# Patient Record
Sex: Male | Born: 1935
Health system: Southern US, Community
[De-identification: ages and names within clinical notes are randomized; demographics above are authoritative.]

## PROBLEM LIST (undated history)

## (undated) DIAGNOSIS — N400 Enlarged prostate without lower urinary tract symptoms: Secondary | ICD-10-CM

## (undated) DIAGNOSIS — R319 Hematuria, unspecified: Secondary | ICD-10-CM

## (undated) DIAGNOSIS — I10 Essential (primary) hypertension: Secondary | ICD-10-CM

## (undated) DIAGNOSIS — K5790 Diverticulosis of intestine, part unspecified, without perforation or abscess without bleeding: Secondary | ICD-10-CM

## (undated) DIAGNOSIS — R7301 Impaired fasting glucose: Secondary | ICD-10-CM

## (undated) DIAGNOSIS — L719 Rosacea, unspecified: Secondary | ICD-10-CM

## (undated) DIAGNOSIS — G44009 Cluster headache syndrome, unspecified, not intractable: Secondary | ICD-10-CM

## (undated) DIAGNOSIS — D369 Benign neoplasm, unspecified site: Secondary | ICD-10-CM

## (undated) DIAGNOSIS — E785 Hyperlipidemia, unspecified: Secondary | ICD-10-CM

## (undated) HISTORY — DX: Hyperlipidemia, unspecified: E78.5

## (undated) HISTORY — DX: Rosacea, unspecified: L71.9

## (undated) HISTORY — DX: Cluster headache syndrome, unspecified, not intractable: G44.009

## (undated) HISTORY — DX: Benign neoplasm, unspecified site: D36.9

## (undated) HISTORY — DX: Diverticulosis of intestine, part unspecified, without perforation or abscess without bleeding: K57.90

## (undated) HISTORY — DX: Impaired fasting glucose: R73.01

## (undated) HISTORY — DX: Benign prostatic hyperplasia without lower urinary tract symptoms: N40.0

## (undated) HISTORY — DX: Hematuria, unspecified: R31.9

## (undated) HISTORY — DX: Essential (primary) hypertension: I10

---

## 1999-11-12 ENCOUNTER — Ambulatory Visit (HOSPITAL_COMMUNITY): Admission: RE | Admit: 1999-11-12 | Discharge: 1999-11-12 | Payer: Self-pay | Admitting: Gastroenterology

## 2005-01-13 ENCOUNTER — Ambulatory Visit (HOSPITAL_COMMUNITY): Admission: RE | Admit: 2005-01-13 | Discharge: 2005-01-13 | Payer: Self-pay | Admitting: Gastroenterology

## 2005-03-31 ENCOUNTER — Ambulatory Visit (HOSPITAL_COMMUNITY): Admission: RE | Admit: 2005-03-31 | Discharge: 2005-03-31 | Payer: Self-pay | Admitting: Urology

## 2011-04-15 ENCOUNTER — Other Ambulatory Visit: Payer: Self-pay | Admitting: Gastroenterology

## 2012-01-26 ENCOUNTER — Other Ambulatory Visit: Payer: Self-pay | Admitting: Dermatology

## 2012-01-26 DIAGNOSIS — C4491 Basal cell carcinoma of skin, unspecified: Secondary | ICD-10-CM

## 2012-01-26 HISTORY — DX: Basal cell carcinoma of skin, unspecified: C44.91

## 2012-05-09 DIAGNOSIS — C4492 Squamous cell carcinoma of skin, unspecified: Secondary | ICD-10-CM

## 2012-05-09 HISTORY — DX: Squamous cell carcinoma of skin, unspecified: C44.92

## 2012-05-24 ENCOUNTER — Other Ambulatory Visit: Payer: Self-pay | Admitting: Dermatology

## 2012-08-03 ENCOUNTER — Other Ambulatory Visit: Payer: Self-pay | Admitting: Dermatology

## 2012-09-14 ENCOUNTER — Other Ambulatory Visit: Payer: Self-pay | Admitting: Dermatology

## 2012-10-19 ENCOUNTER — Other Ambulatory Visit: Payer: Self-pay | Admitting: Dermatology

## 2013-06-06 ENCOUNTER — Other Ambulatory Visit: Payer: Self-pay | Admitting: Dermatology

## 2013-09-05 ENCOUNTER — Other Ambulatory Visit (HOSPITAL_COMMUNITY): Payer: Self-pay | Admitting: Internal Medicine

## 2013-09-05 DIAGNOSIS — M549 Dorsalgia, unspecified: Secondary | ICD-10-CM

## 2013-09-07 ENCOUNTER — Ambulatory Visit (HOSPITAL_COMMUNITY)
Admission: RE | Admit: 2013-09-07 | Discharge: 2013-09-07 | Disposition: A | Discharge status: Home / Self Care | Payer: Medicare Other | Source: Ambulatory Visit | Attending: Internal Medicine | Admitting: Internal Medicine

## 2013-09-07 DIAGNOSIS — Z8051 Family history of malignant neoplasm of kidney: Secondary | ICD-10-CM | POA: Diagnosis not present

## 2013-09-07 DIAGNOSIS — M549 Dorsalgia, unspecified: Secondary | ICD-10-CM | POA: Insufficient documentation

## 2014-11-11 ENCOUNTER — Other Ambulatory Visit: Payer: Self-pay | Admitting: Dermatology

## 2015-01-03 IMAGING — US US RENAL
1 series · 14 of 25 positions shown · non-contrast
Comparison: CT 07/02/2010

CLINICAL DATA: Back pain.  Family history of renal cell carcinoma.

EXAM:
RENAL/URINARY TRACT ULTRASOUND COMPLETE

[Series 1: us renal · 0.21mm/px · 50 acquisitions, 14 frames shown]
[im 1/50]
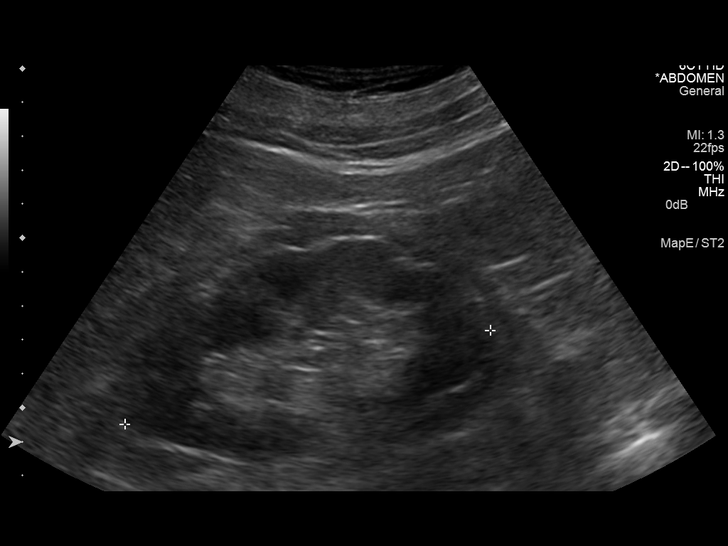
[im 5/50]
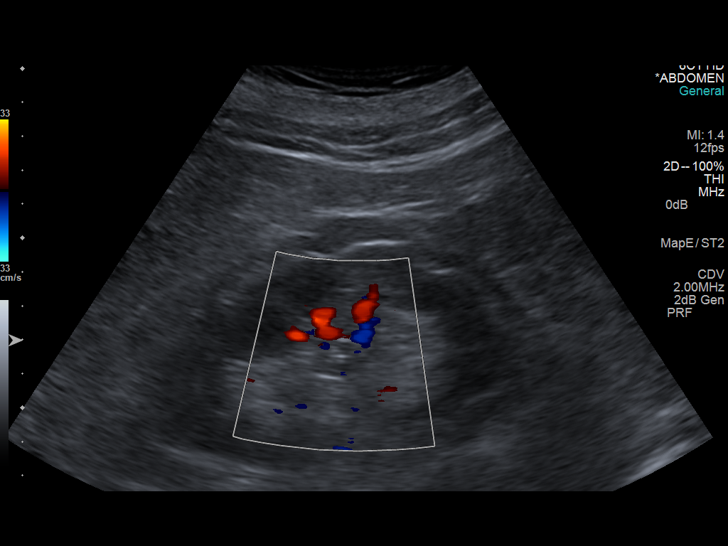
[im 9/50]
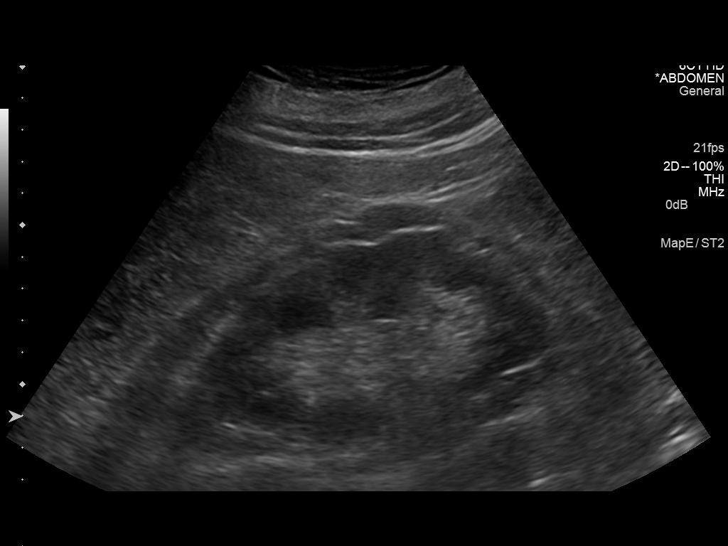
[im 13/50]
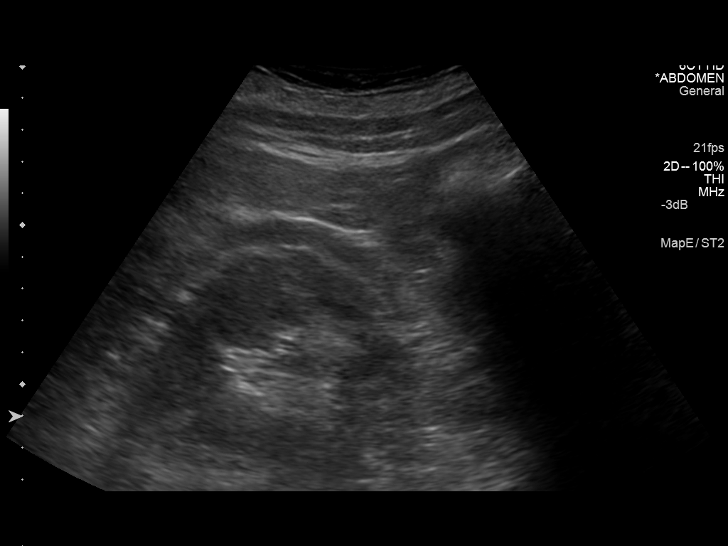
[im 17/50]
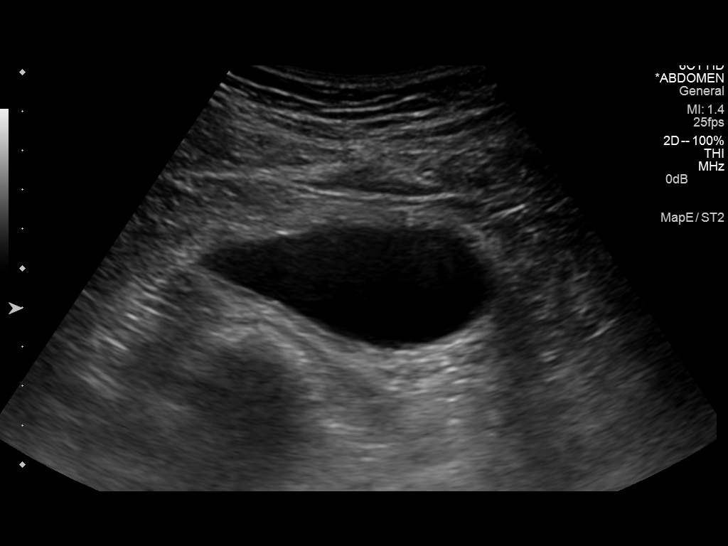
[im 19/50]
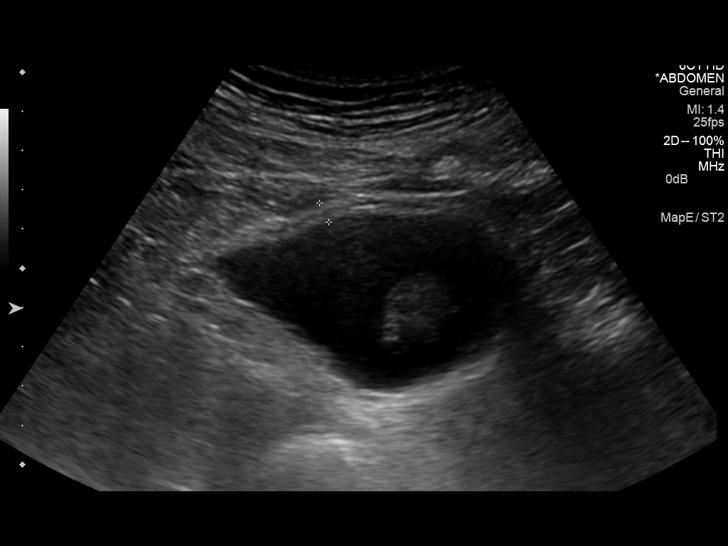
[im 23/50]
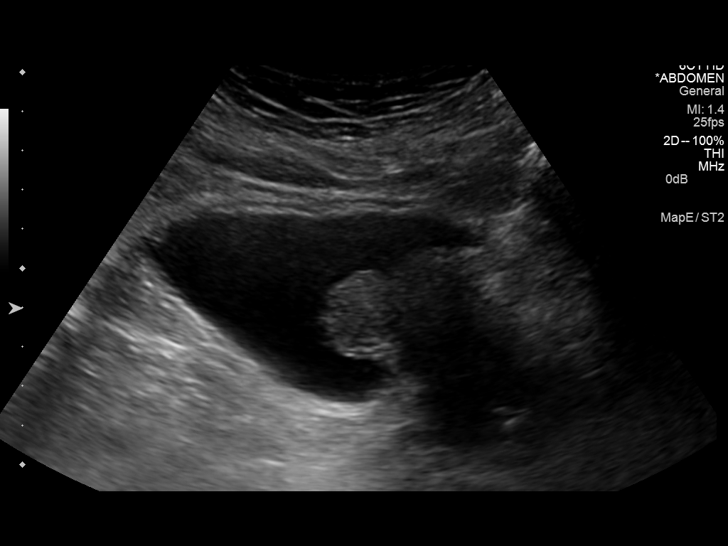
[im 27/50]
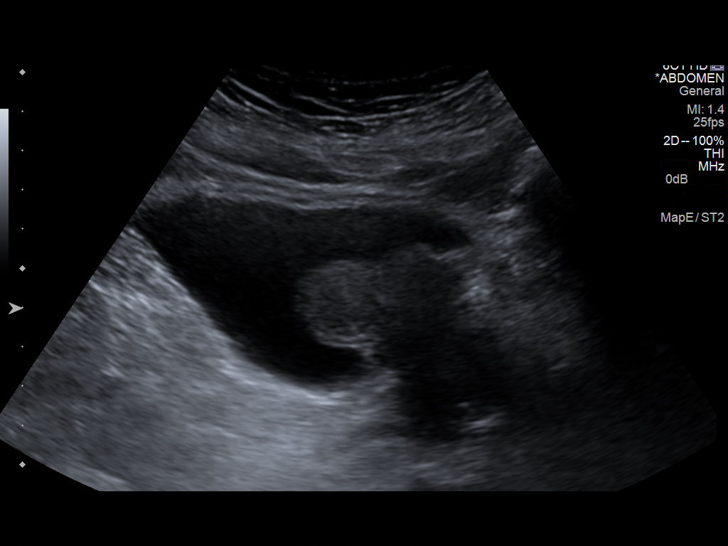
[im 31/50]
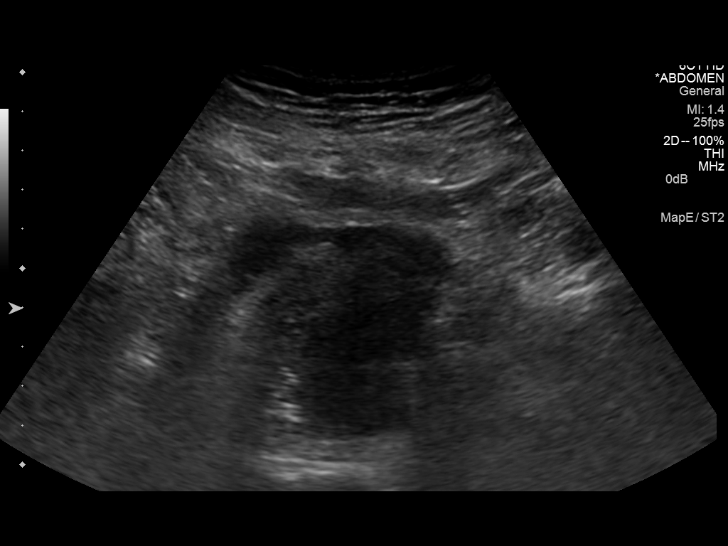
[im 33/50]
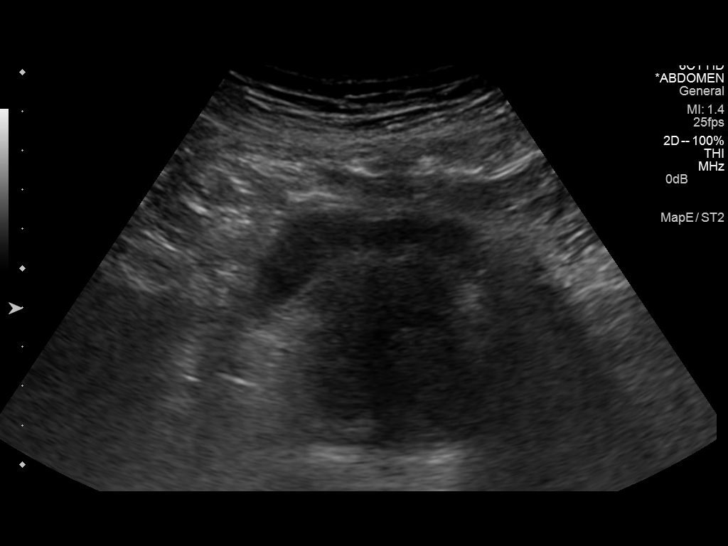
[im 37/50]
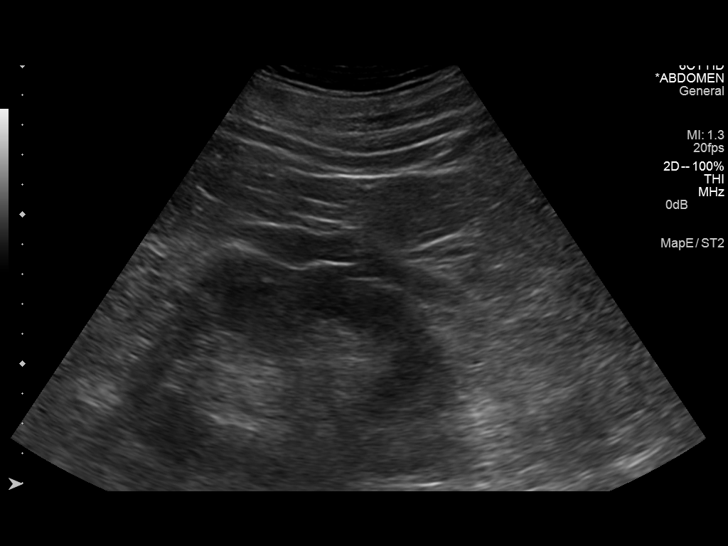
[im 41/50]
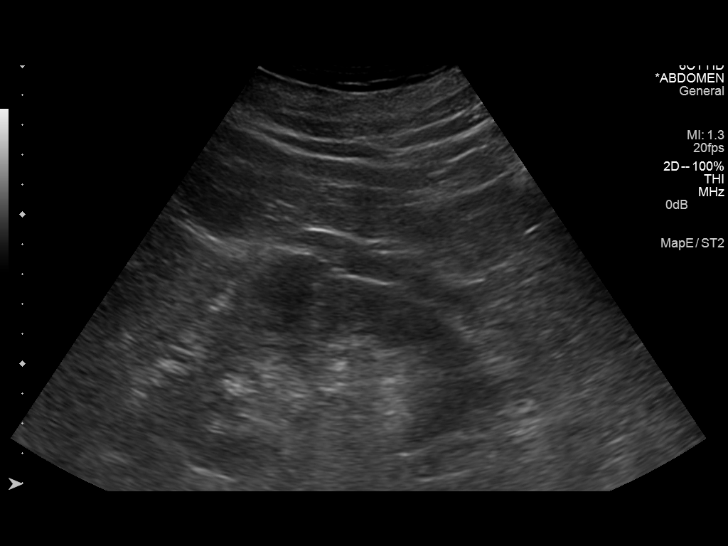
[im 45/50]
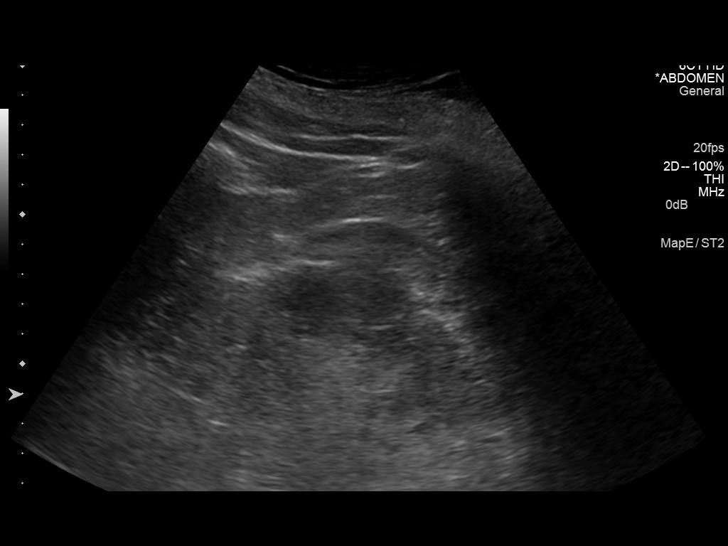
[im 50/50]
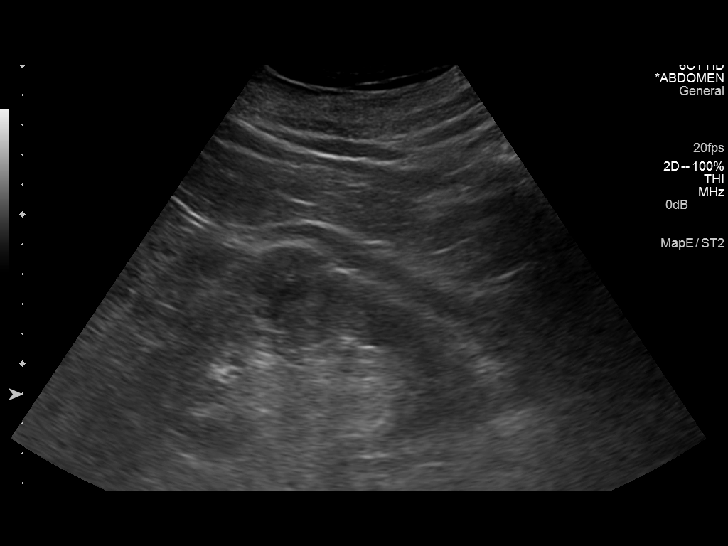

[14 of 25 positions shown; findings below may reference images not displayed]

FINDINGS: Right Kidney:

Length: 11.1 cm. Echogenicity within normal limits. No mass or
hydronephrosis visualized.

Left Kidney:

Length: 11.1 cm. Echogenicity within normal limits. No mass or
hydronephrosis visualized.

Bladder:

The bladder demonstrates mild circumferential wall thickening
measuring 5 mm in thickness.

There is moderate prostatic enlargement with moderate impression
upon the bladder base as these findings appear to have progressed
compared to the previous CT scan. The prostatic impression upon the
bladder base is fairly prominent as it would be difficult to
completely exclude a primary bladder mass.
IMPRESSION: Normal kidneys.

Interval progression with moderate prostatic enlargement and
prominent impression upon the bladder base. It would be difficult to
completely exclude a primary bladder wall mass. Consider followup CT
Urology protocol for further evaluation. Also consider correlation
with PSA level.

## 2015-02-19 DIAGNOSIS — L570 Actinic keratosis: Secondary | ICD-10-CM | POA: Diagnosis not present

## 2015-03-13 DIAGNOSIS — H2513 Age-related nuclear cataract, bilateral: Secondary | ICD-10-CM | POA: Diagnosis not present

## 2015-03-31 DIAGNOSIS — Z1389 Encounter for screening for other disorder: Secondary | ICD-10-CM | POA: Diagnosis not present

## 2015-03-31 DIAGNOSIS — E7800 Pure hypercholesterolemia, unspecified: Secondary | ICD-10-CM | POA: Diagnosis not present

## 2015-03-31 DIAGNOSIS — I10 Essential (primary) hypertension: Secondary | ICD-10-CM | POA: Diagnosis not present

## 2015-03-31 DIAGNOSIS — R7301 Impaired fasting glucose: Secondary | ICD-10-CM | POA: Diagnosis not present

## 2015-07-21 ENCOUNTER — Other Ambulatory Visit: Payer: Self-pay | Admitting: Dermatology

## 2015-07-21 DIAGNOSIS — D0439 Carcinoma in situ of skin of other parts of face: Secondary | ICD-10-CM | POA: Diagnosis not present

## 2015-07-21 DIAGNOSIS — D239 Other benign neoplasm of skin, unspecified: Secondary | ICD-10-CM | POA: Diagnosis not present

## 2015-07-21 DIAGNOSIS — D2239 Melanocytic nevi of other parts of face: Secondary | ICD-10-CM | POA: Diagnosis not present

## 2015-07-21 DIAGNOSIS — L570 Actinic keratosis: Secondary | ICD-10-CM | POA: Diagnosis not present

## 2015-07-21 DIAGNOSIS — D485 Neoplasm of uncertain behavior of skin: Secondary | ICD-10-CM | POA: Diagnosis not present

## 2015-07-21 DIAGNOSIS — L820 Inflamed seborrheic keratosis: Secondary | ICD-10-CM | POA: Diagnosis not present

## 2015-08-21 DIAGNOSIS — D0439 Carcinoma in situ of skin of other parts of face: Secondary | ICD-10-CM | POA: Diagnosis not present

## 2015-10-01 DIAGNOSIS — I10 Essential (primary) hypertension: Secondary | ICD-10-CM | POA: Diagnosis not present

## 2016-02-13 DIAGNOSIS — N401 Enlarged prostate with lower urinary tract symptoms: Secondary | ICD-10-CM | POA: Diagnosis not present

## 2016-02-13 DIAGNOSIS — R350 Frequency of micturition: Secondary | ICD-10-CM | POA: Diagnosis not present

## 2016-04-01 DIAGNOSIS — Z6838 Body mass index (BMI) 38.0-38.9, adult: Secondary | ICD-10-CM | POA: Diagnosis not present

## 2016-04-01 DIAGNOSIS — Z1389 Encounter for screening for other disorder: Secondary | ICD-10-CM | POA: Diagnosis not present

## 2016-04-01 DIAGNOSIS — N400 Enlarged prostate without lower urinary tract symptoms: Secondary | ICD-10-CM | POA: Diagnosis not present

## 2016-04-01 DIAGNOSIS — R7301 Impaired fasting glucose: Secondary | ICD-10-CM | POA: Diagnosis not present

## 2016-04-01 DIAGNOSIS — I10 Essential (primary) hypertension: Secondary | ICD-10-CM | POA: Diagnosis not present

## 2016-04-01 DIAGNOSIS — E669 Obesity, unspecified: Secondary | ICD-10-CM | POA: Diagnosis not present

## 2016-04-01 DIAGNOSIS — Z0000 Encounter for general adult medical examination without abnormal findings: Secondary | ICD-10-CM | POA: Diagnosis not present

## 2016-04-01 DIAGNOSIS — E7800 Pure hypercholesterolemia, unspecified: Secondary | ICD-10-CM | POA: Diagnosis not present

## 2016-04-19 DIAGNOSIS — M9903 Segmental and somatic dysfunction of lumbar region: Secondary | ICD-10-CM | POA: Diagnosis not present

## 2016-04-19 DIAGNOSIS — M9902 Segmental and somatic dysfunction of thoracic region: Secondary | ICD-10-CM | POA: Diagnosis not present

## 2016-04-19 DIAGNOSIS — M9905 Segmental and somatic dysfunction of pelvic region: Secondary | ICD-10-CM | POA: Diagnosis not present

## 2016-04-19 DIAGNOSIS — M5432 Sciatica, left side: Secondary | ICD-10-CM | POA: Diagnosis not present

## 2016-04-21 DIAGNOSIS — M9905 Segmental and somatic dysfunction of pelvic region: Secondary | ICD-10-CM | POA: Diagnosis not present

## 2016-04-21 DIAGNOSIS — M9902 Segmental and somatic dysfunction of thoracic region: Secondary | ICD-10-CM | POA: Diagnosis not present

## 2016-04-21 DIAGNOSIS — M9903 Segmental and somatic dysfunction of lumbar region: Secondary | ICD-10-CM | POA: Diagnosis not present

## 2016-04-21 DIAGNOSIS — M5432 Sciatica, left side: Secondary | ICD-10-CM | POA: Diagnosis not present

## 2016-07-28 ENCOUNTER — Other Ambulatory Visit: Payer: Self-pay | Admitting: Dermatology

## 2016-07-28 DIAGNOSIS — L719 Rosacea, unspecified: Secondary | ICD-10-CM | POA: Diagnosis not present

## 2016-07-28 DIAGNOSIS — C44319 Basal cell carcinoma of skin of other parts of face: Secondary | ICD-10-CM | POA: Diagnosis not present

## 2016-07-28 DIAGNOSIS — L570 Actinic keratosis: Secondary | ICD-10-CM | POA: Diagnosis not present

## 2016-09-30 DIAGNOSIS — I872 Venous insufficiency (chronic) (peripheral): Secondary | ICD-10-CM | POA: Diagnosis not present

## 2016-09-30 DIAGNOSIS — I10 Essential (primary) hypertension: Secondary | ICD-10-CM | POA: Diagnosis not present

## 2016-09-30 DIAGNOSIS — E669 Obesity, unspecified: Secondary | ICD-10-CM | POA: Diagnosis not present

## 2016-09-30 DIAGNOSIS — Z6837 Body mass index (BMI) 37.0-37.9, adult: Secondary | ICD-10-CM | POA: Diagnosis not present

## 2016-10-12 DIAGNOSIS — C44319 Basal cell carcinoma of skin of other parts of face: Secondary | ICD-10-CM | POA: Diagnosis not present

## 2016-10-28 DIAGNOSIS — M9903 Segmental and somatic dysfunction of lumbar region: Secondary | ICD-10-CM | POA: Diagnosis not present

## 2016-10-28 DIAGNOSIS — M5432 Sciatica, left side: Secondary | ICD-10-CM | POA: Diagnosis not present

## 2016-10-28 DIAGNOSIS — M9902 Segmental and somatic dysfunction of thoracic region: Secondary | ICD-10-CM | POA: Diagnosis not present

## 2016-10-28 DIAGNOSIS — M9905 Segmental and somatic dysfunction of pelvic region: Secondary | ICD-10-CM | POA: Diagnosis not present

## 2017-02-02 ENCOUNTER — Ambulatory Visit: Payer: PPO | Admitting: Family Medicine

## 2017-02-02 ENCOUNTER — Encounter: Payer: Self-pay | Admitting: Family Medicine

## 2017-02-02 ENCOUNTER — Other Ambulatory Visit: Payer: Self-pay

## 2017-02-02 VITALS — BP 136/60 | HR 70 | Temp 97.6°F | Resp 16 | Ht 71.0 in | Wt 279.0 lb

## 2017-02-02 DIAGNOSIS — L97929 Non-pressure chronic ulcer of unspecified part of left lower leg with unspecified severity: Secondary | ICD-10-CM

## 2017-02-02 DIAGNOSIS — I872 Venous insufficiency (chronic) (peripheral): Secondary | ICD-10-CM | POA: Diagnosis not present

## 2017-02-02 DIAGNOSIS — R609 Edema, unspecified: Secondary | ICD-10-CM

## 2017-02-02 MED ORDER — FUROSEMIDE 40 MG PO TABS: 40.0000 mg | ORAL_TABLET | Freq: Every day | ORAL | 0 refills | Status: DC

## 2017-02-02 MED ORDER — MUPIROCIN 2 % EX OINT: 1.0000 "application " | TOPICAL_OINTMENT | Freq: Three times a day (TID) | CUTANEOUS | 1 refills | Status: AC

## 2017-02-02 MED ORDER — CEPHALEXIN 500 MG PO CAPS: 500.0000 mg | ORAL_CAPSULE | Freq: Three times a day (TID) | ORAL | 0 refills | Status: AC

## 2017-02-02 NOTE — Patient Instructions (Addendum)
Referral has been made to the wound care center.  You should follow-up with a doctor in about 4 or 5 days, probably either Saturday or Monday if possible.  I realize this is a holiday weekend.  You can return here, see your primary care, or hopefully will be seeing the wound center before then.  Keep legs elevated as much of the time as possible  Avoid excessive salt  Add Lasix 40 mg 1 daily.  Since you are on the lisinopril HCT I would recommend that labs be rechecked for your electrolytes in about 10-14 days.  Get recheck sooner at any time if increased redness.  Use a little Bactroban (mupirocin) ointment on the ulcer itself when dressing the wound.  Take cephalexin 500 mg 3 times daily    IF you received an x-ray today, you will receive an invoice from [facility]. Please contact [facility] at [phone] with questions or concerns regarding your invoice.   IF you received labwork today, you will receive an invoice from [place]. Please contact [facility] at [phone] with questions or concerns regarding your invoice.   Our billing staff will not be able to assist you with questions regarding bills from these companies.  You will be contacted with the lab results as soon as they are available. The fastest way to get your results is to activate your My Chart account. Instructions are located on the last page of this paperwork. If you have not heard from [place] regarding the results in 2 weeks, please contact this office.

## 2017-02-02 NOTE — Progress Notes (Signed)
Patient ID: Aaron Good, male    DOB: 1933-05-23  Age: 83 y.o. MRN: [id number]  Chief Complaint  Patient presents with  . Wound Infection    hit leg 10 weeks ago caused crater in leg, other partes weep     Subjective:   83 year old man who just came back from a cruise a few days ago.  He has before going developed chronically swollen legs, but it has gotten much worse with a constant dripping of fluid from the right leg.  He had hit his shin pulling up a limb in the yard and has a chronic ulcer that is developed on the right mid shin.  He volunteers at [employer].  He is a retired Software engineer.  He had some Keflex from a previous prescription for his wife and he has taken 6 days of that, but he finished that 9 days ago.  He says he did little better while he is on it.  He has some pain in the leg.  Current allergies, medications, problem list, past/family and social histories reviewed.  Objective:  BP 136/60   Pulse 70   Temp 97.6 F (36.4 C)   Resp 16   Ht 5\' 11"  (1.803 m)   Wt 279 lb (126.6 kg)   SpO2 94%   BMI 38.91 kg/m   Heavy man, big swollen legs.  The left leg is dry but has stasis dermatitis.  The right leg has stasis dermatitis from mid calf to ankle.  He has a 2 cm ulcer mid shin.  There is mild erythema.  Not definite as to whether there is any cellulitis.  Tender to touch.  Assessment & Plan:   Assessment: 1. Leg ulcer, left, with unspecified severity (HCC)   2. Stasis dermatitis of both legs   3. Edema, unspecified type       Plan: See instructions  Orders Placed This Encounter  Procedures  . WOUND CULTURE    Order Specific Question:   Source    Answer:   right leg  . Basic metabolic panel  . AMB referral to wound care center    Referral Priority:   Urgent    Referral Type:   Consultation    Number of Visits Requested:   1    Meds ordered this encounter  Medications  . furosemide (LASIX) 40 MG tablet    Sig: Take 1 tablet (40 mg total) by  mouth daily.    Dispense:  30 tablet    Refill:  0  . cephALEXin (KEFLEX) 500 MG capsule    Sig: Take 1 capsule (500 mg total) by mouth 3 (three) times daily.    Dispense:  45 capsule    Refill:  0  . mupirocin ointment (BACTROBAN) 2 %    Sig: Apply 1 application topically 3 (three) times daily.    Dispense:  22 g    Refill:  1         Patient Instructions   Referral has been made to the wound care center.  You should follow-up with a doctor in about 4 or 5 days, probably either Saturday or Monday if possible.  I realize this is a holiday weekend.  You can return here, see your primary care, or hopefully will be seeing the wound center before then.  Keep legs elevated as much of the time as possible  Avoid excessive salt  Add Lasix 40 mg 1 daily.  Since you are on the lisinopril HCT I would  recommend that labs be rechecked for your electrolytes in about 10-14 days.  Get recheck sooner at any time if increased redness.  Use a little Bactroban (mupirocin) ointment on the ulcer itself when dressing the wound.  Take cephalexin 500 mg 3 times daily    IF you received an x-ray today, you will receive an invoice from [facility]. Please contact [facility] at [phone] with questions or concerns regarding your invoice.   IF you received labwork today, you will receive an invoice from [place]. Please contact [facility] at [phone] with questions or concerns regarding your invoice.   Our billing staff will not be able to assist you with questions regarding bills from these companies.  You will be contacted with the lab results as soon as they are available. The fastest way to get your results is to activate your My Chart account. Instructions are located on the last page of this paperwork. If you have not heard from [place] regarding the results in 2 weeks, please contact this office.         No Follow-up on file.   Marguerita Stapp, MD 02/02/2017

## 2017-02-03 LAB — BASIC METABOLIC PANEL
BUN/Creatinine Ratio: 20 (ref 10–24)
BUN: 21 mg/dL (ref 8–27)
CALCIUM: 9.9 mg/dL (ref 8.6–10.2)
CO2: 26 mmol/L (ref 20–29)
CREATININE: 1.05 mg/dL (ref 0.76–1.27)
Chloride: 98 mmol/L (ref 96–106)
GFR, EST AFRICAN AMERICAN: 77 mL/min/{1.73_m2} (ref >59)
GFR, EST NON AFRICAN AMERICAN: 66 mL/min/{1.73_m2} (ref >59)
Glucose: 133 mg/dL — ABNORMAL HIGH (ref 65–99)
POTASSIUM: 4.3 mmol/L (ref 3.5–5.2)
Sodium: 141 mmol/L (ref 134–144)

## 2017-02-06 LAB — WOUND CULTURE

## 2017-02-07 ENCOUNTER — Ambulatory Visit: Payer: PPO | Admitting: Emergency Medicine

## 2017-02-07 ENCOUNTER — Encounter: Payer: Self-pay | Admitting: Emergency Medicine

## 2017-02-07 ENCOUNTER — Other Ambulatory Visit: Payer: Self-pay

## 2017-02-07 VITALS — BP 130/60 | HR 64 | Temp 98.7°F | Resp 16 | Ht 71.5 in | Wt 276.2 lb

## 2017-02-07 DIAGNOSIS — A4901 Methicillin susceptible Staphylococcus aureus infection, unspecified site: Secondary | ICD-10-CM | POA: Diagnosis not present

## 2017-02-07 DIAGNOSIS — R609 Edema, unspecified: Secondary | ICD-10-CM

## 2017-02-07 DIAGNOSIS — L97929 Non-pressure chronic ulcer of unspecified part of left lower leg with unspecified severity: Secondary | ICD-10-CM | POA: Diagnosis not present

## 2017-02-07 DIAGNOSIS — I872 Venous insufficiency (chronic) (peripheral): Secondary | ICD-10-CM | POA: Insufficient documentation

## 2017-02-07 NOTE — Patient Instructions (Addendum)
     IF you received an x-ray today, you will receive an invoice from [facility]. Please contact [facility] at [phone] with questions or concerns regarding your invoice.   IF you received labwork today, you will receive an invoice from [facility]. Please contact [facility] at [phone] with questions or concerns regarding your invoice.   Our billing staff will not be able to assist you with questions regarding bills from these companies.  You will be contacted with the lab results as soon as they are available. The fastest way to get your results is to activate your My Chart account. Instructions are located on the last page of this paperwork. If you have not heard from us regarding the results in 2 weeks, please contact this office.      Cellulitis, Adult Cellulitis is a skin infection. The infected area is usually red and sore. This condition occurs most often in the arms and lower legs. It is very important to get treated for this condition. Follow these instructions at home:  Take over-the-counter and prescription medicines only as told by your doctor.  If you were prescribed an antibiotic medicine, take it as told by your doctor. Do not stop taking the antibiotic even if you start to feel better.  Drink enough fluid to keep your pee (urine) clear or pale yellow.  Do not touch or rub the infected area.  Raise (elevate) the infected area above the level of your heart while you are sitting or lying down.  Place warm or cold wet cloths (warm or cold compresses) on the infected area. Do this as told by your doctor.  Keep all follow-up visits as told by your doctor. This is important. These visits let your doctor make sure your infection is not getting worse. Contact a doctor if:  You have a fever.  Your symptoms do not get better after 1-2 days of treatment.  Your bone or joint under the infected area starts to hurt after the skin has healed.  Your  infection comes back. This can happen in the same area or another area.  You have a swollen bump in the infected area.  You have new symptoms.  You feel ill and also have muscle aches and pains. Get help right away if:  Your symptoms get worse.  You feel very sleepy.  You throw up (vomit) or have watery poop (diarrhea) for a long time.  There are red streaks coming from the infected area.  Your red area gets larger.  Your red area turns darker. This information is not intended to replace advice given to you by your health care provider. Make sure you discuss any questions you have with your health care provider. Document Released: 07/14/2007 Document Revised: 07/03/2015 Document Reviewed: 12/04/2014 Elsevier Interactive Patient Education  2018 Elsevier Inc.  

## 2017-02-07 NOTE — Progress Notes (Signed)
Aaron Good 83 y.o.   Chief Complaint  Patient presents with  . Follow-up    RIGHT leg ulcer, per patient LEFT leg swelling now    HISTORY OF PRESENT ILLNESS: This is a 83 y.o. male here for follow up of right lower leg infected ulcer; on Keflex; doing better; culture grew Staph aureus sensitive to cephalosporins; has h/o chronic edema venous stasis to LE. No new symptomatology.  HPI   Prior to Admission medications   Medication Sig Start Date End Date Taking? Authorizing Provider  aspirin 81 MG tablet Take 81 mg by mouth daily.   Yes [provider]  atorvastatin (LIPITOR) 40 MG tablet Take 40 mg by mouth daily.   Yes [provider]  b complex vitamins tablet Take 1 tablet by mouth daily.   Yes [provider]  cephALEXin (KEFLEX) 500 MG capsule Take 1 capsule (500 mg total) by mouth 3 (three) times daily. 02/02/17  Yes Posey Boyer, MD  furosemide (LASIX) 40 MG tablet Take 1 tablet (40 mg total) by mouth daily. 02/02/17  Yes Posey Boyer, MD  lisinopril-hydrochlorothiazide (PRINZIDE,ZESTORETIC) 20-25 MG per tablet Take 1 tablet by mouth daily.   Yes [provider]  mupirocin ointment (BACTROBAN) 2 % Apply 1 application topically 3 (three) times daily. 02/02/17  Yes Posey Boyer, MD  tamsulosin (FLOMAX) 0.4 MG CAPS capsule Take 0.4 mg by mouth.   Yes [provider]    Allergies  Allergen Reactions  . Ivp Dye [Iodinated Diagnostic Agents]     HANDS ITCH AND RASH    There are no active problems to display for this patient.   Past Medical History:  Diagnosis Date  . Adenomatous polyps    IN RECTUM  . BPH (benign prostatic hyperplasia)   . Cluster headaches   . Diverticulosis   . Hematuria   . Hyperlipidemia   . Hypertension   . Impaired fasting glucose   . Rosacea     No past surgical history on file.  Social History   Socioeconomic History  . Marital status: Married    Spouse name: Not on file  . Number  of children: Not on file  . Years of education: Not on file  . Highest education level: Not on file  Social Needs  . Financial resource strain: Not on file  . Food insecurity - worry: Not on file  . Food insecurity - inability: Not on file  . Transportation needs - medical: Not on file  . Transportation needs - non-medical: Not on file  Occupational History  . Not on file  Tobacco Use  . Smoking status: Former Research scientist (life sciences)  . Smokeless tobacco: Never Used  Substance and Sexual Activity  . Alcohol use: Yes  . Drug use: No  . Sexual activity: Not on file  Other Topics Concern  . Not on file  Social History Narrative  . Not on file    No family history on file.   Review of Systems  Constitutional: Negative for chills and fever.  Respiratory: Negative for shortness of breath.   Cardiovascular: Positive for leg swelling (bilateral lower leg). Negative for chest pain.  Gastrointestinal: Negative for abdominal pain, nausea and vomiting.  Musculoskeletal: Negative for joint pain.  Skin: Positive for rash.  Neurological: Negative for sensory change and focal weakness.  All other systems reviewed and are negative.   Recent Results (from the past 2160 hour(s))  WOUND CULTURE     Status: Abnormal  Collection Time: 02/02/17  9:11 AM  Result Value Ref Range   Gram Stain Result Final report    Organism ID, Bacteria Comment     Comment: Few white blood cells.   Organism ID, Bacteria Comment     Comment: Many gram negative rods.   Organism ID, Bacteria Comment     Comment: Moderate number of gram positive cocci.   Aerobic Bacterial Culture Final report (A)    Organism ID, Bacteria Staphylococcus aureus (A)     Comment: Heavy growth Based on susceptibility to oxacillin this isolate would be susceptible to: *Penicillinase-stable penicillins, such as:   Cloxacillin, Dicloxacillin, Nafcillin *Beta-lactam combination agents, such as:   Amoxicillin-clavulanic acid,  Ampicillin-sulbactam,   Piperacillin-tazobactam *Oral cephems, such as:   Cefaclor, Cefdinir, Cefpodoxime, Cefprozil, Cefuroxime,   Cephalexin, Loracarbef *Parenteral cephems, such as:   Cefazolin, Cefepime, Cefotaxime, Cefotetan, Ceftaroline,   Ceftizoxime, Ceftriaxone, Cefuroxime *Carbapenems, such as:   Doripenem, Ertapenem, Imipenem, Meropenem    Organism ID, Bacteria Comment     Comment: Multiple negative rods present, none predominant. This is indicative of a heavily colonized/contaminated specimen site. No further microbiological characterization will be done as it will not provide clinically relevant information. Heavy growth    Antimicrobial Susceptibility Comment     Comment:       ** S = Susceptible; I = Intermediate; R = Resistant **                    P = Positive; N = Negative             MICS are expressed in micrograms per mL    Antibiotic                 RSLT#1    RSLT#2    RSLT#3    RSLT#4 Ciprofloxacin                  S Clindamycin                    S Erythromycin                   S Gentamicin                     S Levofloxacin                   S Linezolid                      S Moxifloxacin                   S Oxacillin                      S Penicillin                     R Quinupristin/Dalfopristin      S Rifampin                       S Tetracycline                   S Trimethoprim/Sulfa             S Vancomycin                     S   Basic metabolic panel     Status:  Abnormal   Collection Time: 02/02/17  9:57 AM  Result Value Ref Range   Glucose 133 (H) 65 - 99 mg/dL   BUN 21 8 - 27 mg/dL   Creatinine, Ser 1.05 0.76 - 1.27 mg/dL   GFR calc non Af Amer 66 >59 mL/min/1.73   GFR calc Af Amer 77 >59 mL/min/1.73   BUN/Creatinine Ratio 20 10 - 24   Sodium 141 134 - 144 mmol/L   Potassium 4.3 3.5 - 5.2 mmol/L   Chloride 98 96 - 106 mmol/L   CO2 26 20 - 29 mmol/L   Calcium 9.9 8.6 - 10.2 mg/dL   Vitals:   02/07/17 0832  BP: 130/60  Pulse:  64  Resp: 16  Temp: 98.7 F (37.1 C)  SpO2: 94%    Physical Exam  Constitutional: He is oriented to person, place, and time. He appears well-developed and well-nourished.  HENT:  Head: Normocephalic and atraumatic.  Eyes: Pupils are equal, round, and reactive to light.  Neck: Normal range of motion.  Cardiovascular: Normal rate.  Pulmonary/Chest: Effort normal.  Neurological: He is alert and oriented to person, place, and time.  Skin:  LE: +chronic edema; +infected ulcer to mid-lateral shin right leg, still draining but much less per patient.  Psychiatric: He has a normal mood and affect. His behavior is normal.  Vitals reviewed.    ASSESSMENT & PLAN: Rishab was seen today for follow-up.  Diagnoses and all orders for this visit:  Leg ulcer, left, with unspecified severity ([place]) Comments: infected Orders: -     AMB referral to wound care center  Stasis dermatitis of both legs  Edema, unspecified type  Staph aureus infection    Patient Instructions       IF you received an x-ray today, you will receive an invoice from [facility]. Please contact [facility] at [phone] with questions or concerns regarding your invoice.   IF you received labwork today, you will receive an invoice from [place]. Please contact [facility] at [phone] with questions or concerns regarding your invoice.   Our billing staff will not be able to assist you with questions regarding bills from these companies.  You will be contacted with the lab results as soon as they are available. The fastest way to get your results is to activate your My Chart account. Instructions are located on the last page of this paperwork. If you have not heard from [place] regarding the results in 2 weeks, please contact this office.     Cellulitis, Adult Cellulitis is a skin infection. The infected area is usually red and sore. This condition occurs most often in the arms and lower legs. It  is very important to get treated for this condition. Follow these instructions at home:  Take over-the-counter and prescription medicines only as told by your doctor.  If you were prescribed an antibiotic medicine, take it as told by your doctor. Do not stop taking the antibiotic even if you start to feel better.  Drink enough fluid to keep your pee (urine) clear or pale yellow.  Do not touch or rub the infected area.  Raise (elevate) the infected area above the level of your heart while you are sitting or lying down.  Place warm or cold wet cloths (warm or cold compresses) on the infected area. Do this as told by your doctor.  Keep all follow-up visits as told by your doctor. This is important. These visits let your doctor make sure your infection is not  getting worse. Contact a doctor if:  You have a fever.  Your symptoms do not get better after 1-2 days of treatment.  Your bone or joint under the infected area starts to hurt after the skin has healed.  Your infection comes back. This can happen in the same area or another area.  You have a swollen bump in the infected area.  You have new symptoms.  You feel ill and also have muscle aches and pains. Get help right away if:  Your symptoms get worse.  You feel very sleepy.  You throw up (vomit) or have watery poop (diarrhea) for a long time.  There are red streaks coming from the infected area.  Your red area gets larger.  Your red area turns darker. This information is not intended to replace advice given to you by your health care provider. Make sure you discuss any questions you have with your health care provider. Document Released: 07/14/2007 Document Revised: 07/03/2015 Document Reviewed: 12/04/2014 Elsevier Interactive Patient Education  2018 Elsevier Inc.      Agustina Caroli, MD [facility]

## 2017-02-09 DIAGNOSIS — L97909 Non-pressure chronic ulcer of unspecified part of unspecified lower leg with unspecified severity: Secondary | ICD-10-CM | POA: Diagnosis not present

## 2017-02-09 DIAGNOSIS — I872 Venous insufficiency (chronic) (peripheral): Secondary | ICD-10-CM | POA: Diagnosis not present

## 2017-02-09 DIAGNOSIS — L080 Pyoderma: Secondary | ICD-10-CM | POA: Diagnosis not present

## 2017-02-14 DIAGNOSIS — I872 Venous insufficiency (chronic) (peripheral): Secondary | ICD-10-CM | POA: Diagnosis not present

## 2017-02-14 DIAGNOSIS — L97919 Non-pressure chronic ulcer of unspecified part of right lower leg with unspecified severity: Secondary | ICD-10-CM | POA: Diagnosis not present

## 2017-02-14 DIAGNOSIS — I878 Other specified disorders of veins: Secondary | ICD-10-CM | POA: Insufficient documentation

## 2017-02-14 DIAGNOSIS — I83019 Varicose veins of right lower extremity with ulcer of unspecified site: Secondary | ICD-10-CM | POA: Diagnosis not present

## 2017-02-15 ENCOUNTER — Telehealth: Payer: Self-pay | Admitting: Emergency Medicine

## 2017-02-15 NOTE — Telephone Encounter (Signed)
Called pt to see if he was ever scheduled for Wound Care at [facility]. He said he declined that appt and is being seen at [facility] by Dr. Zigmund Daniel. The pt also said he is scheduled to have ultrasounds on 03/03/17. The pt asked about his lab results from 02/02/17 and wanted to know if someone could call him about these as well as if they could be mailed. Please advise. Thanks!

## 2017-02-17 DIAGNOSIS — R351 Nocturia: Secondary | ICD-10-CM | POA: Diagnosis not present

## 2017-02-17 DIAGNOSIS — R310 Gross hematuria: Secondary | ICD-10-CM | POA: Diagnosis not present

## 2017-02-17 DIAGNOSIS — N401 Enlarged prostate with lower urinary tract symptoms: Secondary | ICD-10-CM | POA: Diagnosis not present

## 2017-02-17 DIAGNOSIS — R350 Frequency of micturition: Secondary | ICD-10-CM | POA: Diagnosis not present

## 2017-02-17 NOTE — Telephone Encounter (Signed)
Please advise on labs for Aaron Good.

## 2017-02-18 NOTE — Telephone Encounter (Signed)
Call --- wound culture grew staph; Keflex that was prescribed at visit with Dr. Linna Darner will appropriately treat his infection.   Patient seen by Linna Darner on 02/02/17 and follow-up with [facility] on 02/07/17.   Dr. Mitchel Honour reviewed lab results during visit on 02/07/17 and Sagardia referred to wound center after Linna Darner also referred to [place] on 02/02/17.  Is there anything else that the patient is needing?

## 2017-03-03 DIAGNOSIS — L97919 Non-pressure chronic ulcer of unspecified part of right lower leg with unspecified severity: Secondary | ICD-10-CM | POA: Diagnosis not present

## 2017-03-03 DIAGNOSIS — I83019 Varicose veins of right lower extremity with ulcer of unspecified site: Secondary | ICD-10-CM | POA: Diagnosis not present

## 2017-03-03 DIAGNOSIS — E119 Type 2 diabetes mellitus without complications: Secondary | ICD-10-CM | POA: Diagnosis not present

## 2017-03-03 DIAGNOSIS — I10 Essential (primary) hypertension: Secondary | ICD-10-CM | POA: Diagnosis not present

## 2017-03-03 DIAGNOSIS — E785 Hyperlipidemia, unspecified: Secondary | ICD-10-CM | POA: Diagnosis not present

## 2017-03-03 DIAGNOSIS — I872 Venous insufficiency (chronic) (peripheral): Secondary | ICD-10-CM | POA: Diagnosis not present

## 2017-03-03 DIAGNOSIS — R600 Localized edema: Secondary | ICD-10-CM | POA: Diagnosis not present

## 2017-04-08 DIAGNOSIS — I872 Venous insufficiency (chronic) (peripheral): Secondary | ICD-10-CM | POA: Diagnosis not present

## 2017-04-08 DIAGNOSIS — I10 Essential (primary) hypertension: Secondary | ICD-10-CM | POA: Diagnosis not present

## 2017-04-08 DIAGNOSIS — E7800 Pure hypercholesterolemia, unspecified: Secondary | ICD-10-CM | POA: Diagnosis not present

## 2017-04-08 DIAGNOSIS — E119 Type 2 diabetes mellitus without complications: Secondary | ICD-10-CM | POA: Diagnosis not present

## 2017-04-08 DIAGNOSIS — Z1389 Encounter for screening for other disorder: Secondary | ICD-10-CM | POA: Diagnosis not present

## 2017-04-08 DIAGNOSIS — Z0000 Encounter for general adult medical examination without abnormal findings: Secondary | ICD-10-CM | POA: Diagnosis not present

## 2017-04-08 DIAGNOSIS — N400 Enlarged prostate without lower urinary tract symptoms: Secondary | ICD-10-CM | POA: Diagnosis not present

## 2017-05-05 DIAGNOSIS — H5211 Myopia, right eye: Secondary | ICD-10-CM | POA: Diagnosis not present

## 2017-05-05 DIAGNOSIS — H52202 Unspecified astigmatism, left eye: Secondary | ICD-10-CM | POA: Diagnosis not present

## 2017-05-05 DIAGNOSIS — H5203 Hypermetropia, bilateral: Secondary | ICD-10-CM | POA: Diagnosis not present

## 2017-05-05 DIAGNOSIS — H52201 Unspecified astigmatism, right eye: Secondary | ICD-10-CM | POA: Diagnosis not present

## 2017-05-05 DIAGNOSIS — H524 Presbyopia: Secondary | ICD-10-CM | POA: Diagnosis not present

## 2017-05-05 DIAGNOSIS — H5202 Hypermetropia, left eye: Secondary | ICD-10-CM | POA: Diagnosis not present

## 2017-08-01 DIAGNOSIS — R351 Nocturia: Secondary | ICD-10-CM | POA: Diagnosis not present

## 2017-08-01 DIAGNOSIS — N401 Enlarged prostate with lower urinary tract symptoms: Secondary | ICD-10-CM | POA: Diagnosis not present

## 2017-08-01 DIAGNOSIS — R310 Gross hematuria: Secondary | ICD-10-CM | POA: Diagnosis not present

## 2017-08-16 DIAGNOSIS — R310 Gross hematuria: Secondary | ICD-10-CM | POA: Diagnosis not present

## 2017-08-29 DIAGNOSIS — R351 Nocturia: Secondary | ICD-10-CM | POA: Diagnosis not present

## 2017-08-29 DIAGNOSIS — N401 Enlarged prostate with lower urinary tract symptoms: Secondary | ICD-10-CM | POA: Diagnosis not present

## 2017-08-29 DIAGNOSIS — R310 Gross hematuria: Secondary | ICD-10-CM | POA: Diagnosis not present

## 2017-08-29 DIAGNOSIS — K5900 Constipation, unspecified: Secondary | ICD-10-CM | POA: Diagnosis not present

## 2017-10-17 DIAGNOSIS — E119 Type 2 diabetes mellitus without complications: Secondary | ICD-10-CM | POA: Diagnosis not present

## 2017-10-17 DIAGNOSIS — I10 Essential (primary) hypertension: Secondary | ICD-10-CM | POA: Diagnosis not present

## 2017-10-17 DIAGNOSIS — R319 Hematuria, unspecified: Secondary | ICD-10-CM | POA: Diagnosis not present

## 2017-12-30 ENCOUNTER — Encounter: Payer: Self-pay | Admitting: Family Medicine

## 2017-12-30 ENCOUNTER — Ambulatory Visit (INDEPENDENT_AMBULATORY_CARE_PROVIDER_SITE_OTHER): Payer: PPO | Admitting: Family Medicine

## 2017-12-30 VITALS — BP 112/70 | HR 67 | Temp 97.9°F | Resp 16 | Ht 71.5 in | Wt 253.2 lb

## 2017-12-30 DIAGNOSIS — H109 Unspecified conjunctivitis: Secondary | ICD-10-CM | POA: Diagnosis not present

## 2017-12-30 MED ORDER — POLYMYXIN B-TRIMETHOPRIM 10000-0.1 UNIT/ML-% OP SOLN: 1.0000 [drp] | Freq: Four times a day (QID) | OPHTHALMIC | 0 refills | Status: DC

## 2017-12-30 NOTE — Patient Instructions (Addendum)
If you have lab work done today you will be contacted with your lab results within the next 2 weeks.  If you have not heard from [place] then please contact us. The fastest way to get your results is to register for My Chart.   IF you received an x-ray today, you will receive an invoice from [facility]. Please contact [facility] at [phone] with questions or concerns regarding your invoice.   IF you received labwork today, you will receive an invoice from [place]. Please contact [facility] at [phone] with questions or concerns regarding your invoice.   Our billing staff will not be able to assist you with questions regarding bills from these companies.  You will be contacted with the lab results as soon as they are available. The fastest way to get your results is to activate your My Chart account. Instructions are located on the last page of this paperwork. If you have not heard from [place] regarding the results in 2 weeks, please contact this office.    How to Use Eye Drops and Eye Ointments How to apply eye drops Follow these steps when applying eye drops: 1. Wash your hands. 2. Tilt your head back. 3. Put a finger under your eye and use it to gently pull your lower lid downward. Keep that finger in place. 4. Using your other hand, hold the dropper between your thumb and index finger. 5. Position the dropper just over the edge of the lower lid. Hold it as close to your eye as you can without touching the dropper to your eye. 6. Steady your hand. One way to do this is to lean your index finger against your brow. 7. Look up. 8. Slowly and gently squeeze one drop of medicine into your eye. 9. Close your eye. 10. Place a finger between your lower eyelid and your nose. Press gently for 2 minutes. This increases the amount of time that the medicine is exposed to the eye. It also reduces side effects that can develop if the drop gets into the bloodstream through the  nose.  How to apply eye ointments Follow these steps when applying eye ointments: 1. Wash your hands. 2. Put a finger under your eye and use it to gently pull your lower lid downward. Keep that finger in place. 3. Using your other hand, place the tip of the tube between your thumb and index finger with the remaining fingers braced against your cheek or nose. 4. Hold the tube just over the edge of your lower lid without touching the tube to your lid or eyeball. 5. Look up. 6. Line the inner part of your lower lid with ointment. 7. Gently pull up on your upper lid and look down. This will force the ointment to spread over the surface of the eye. 8. Release the upper lid. 9. If you can, close your eyes for 1-2 minutes.  Do not rub your eyes. If you applied the ointment correctly, your vision will be blurry for a few minutes. This is normal. Additional information  Make sure to use the eye drops or ointment as told by your health care provider.  If you have been told to use both eye drops and an eye ointment, apply the eye drops first, then wait 3-4 minutes before you apply the ointment.  Try not to touch the tip of the dropper or tube to your eye. A dropper or tube that has touched the eye can become contaminated.  This information is not intended to replace advice given to you by your health care provider. Make sure you discuss any questions you have with your health care provider. Document Released: 05/03/2000 Document Revised: 06/26/2015 Document Reviewed: 01/21/2014 Elsevier Interactive Patient Education  [employer].

## 2017-12-30 NOTE — Progress Notes (Signed)
Chief Complaint  Patient presents with  . ?conjunctivitis    onset: 12/18/2017, using chinese eye gtts with no relief, wakes up with pasty matted eyes with mucus drainage, is somewhat better but pt is going to [place] for 8 days and wants tx before going.    HPI  Patient reports that he has been having symptoms of eye irriation since 12/18/17. He reports that he been having itchy eyes He states that he was taking eye drops from [place] that he got 3 years ago  He is going to [place] and is here to seek treatment. No fevers or chills No cough or wheezing No ear pain or tinnitus  Past Medical History:  Diagnosis Date  . Adenomatous polyps    IN RECTUM  . BPH (benign prostatic hyperplasia)   . Cluster headaches   . Diverticulosis   . Hematuria   . Hyperlipidemia   . Hypertension   . Impaired fasting glucose   . Rosacea     Current Outpatient Medications  Medication Sig Dispense Refill  . aspirin 81 MG tablet Take 81 mg by mouth daily.    Marland Kitchen atorvastatin (LIPITOR) 40 MG tablet Take 40 mg by mouth daily.    . furosemide (LASIX) 40 MG tablet Take 1 tablet (40 mg total) by mouth daily. 30 tablet 0  . lisinopril-hydrochlorothiazide (PRINZIDE,ZESTORETIC) 20-25 MG per tablet Take 1 tablet by mouth daily.    . tamsulosin (FLOMAX) 0.4 MG CAPS capsule Take 0.4 mg by mouth.    Marland Kitchen b complex vitamins tablet Take 1 tablet by mouth daily.    . cephALEXin (KEFLEX) 500 MG capsule Take 1 capsule (500 mg total) by mouth 3 (three) times daily. (Patient not taking: Reported on 12/30/2017) 45 capsule 0  . mupirocin ointment (BACTROBAN) 2 % Apply 1 application topically 3 (three) times daily. (Patient not taking: Reported on 12/30/2017) 22 g 1  . trimethoprim-polymyxin b (POLYTRIM) ophthalmic solution Place 1 drop into the right eye every 6 (six) hours. 10 mL 0   No current facility-administered medications for this visit.     Allergies:  Allergies  Allergen Reactions  . Ivp Dye [Iodinated [employer]]     HANDS ITCH AND RASH    No past surgical history on file.  Social History   Socioeconomic History  . Marital status: Married    Spouse name: Not on file  . Number of children: Not on file  . Years of education: Not on file  . Highest education level: Not on file  Occupational History  . Not on file  Social Needs  . Financial resource strain: Not on file  . Food insecurity:    Worry: Not on file    Inability: Not on file  . Transportation needs:    Medical: Not on file    Non-medical: Not on file  Tobacco Use  . Smoking status: Former Research scientist (life sciences)  . Smokeless tobacco: Never Used  Substance and Sexual Activity  . Alcohol use: Yes  . Drug use: No  . Sexual activity: Not on file  Lifestyle  . Physical activity:    Days per week: Not on file    Minutes per session: Not on file  . Stress: Not on file  Relationships  . Social connections:    Talks on phone: Not on file    Gets together: Not on file    Attends religious service: Not on file    Active member of club or organization: Not on file  Attends meetings of clubs or organizations: Not on file    Relationship status: Not on file  Other Topics Concern  . Not on file  Social History Narrative  . Not on file    No family history on file.   ROS Review of Systems See HPI Constitution: No fevers or chills No malaise No diaphoresis Skin: No rash or itching Eyes: no blurry vision, no double vision GU: no dysuria or hematuria Neuro: no dizziness or headaches all others reviewed and negative   Objective: Vitals:   12/30/17 1236  BP: 112/70  Pulse: 67  Resp: 16  Temp: 97.9 F (36.6 C)  TempSrc: Oral  SpO2: 98%  Weight: 253 lb 3.2 oz (114.9 kg)  Height: 5' 11.5" (1.816 m)    Physical Exam  General: alert, oriented, in NAD Head: normocephalic, atraumatic, no sinus tenderness Eyes: EOM intact, no scleral icterus, right eye lower lid with some discharge, mild conjunctival injection Ears: TM  clear bilaterally Nose: mucosa nonerythematous, nonedematous Throat: no pharyngeal exudate or erythema Lymph: no posterior auricular, submental or cervical lymph adenopathy Heart: normal rate, normal sinus rhythm, no murmurs Lungs: clear to auscultation bilaterally, no wheezing   Assessment and Plan Aaron Good was seen today for ?conjunctivitis.  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     [place]

## 2018-02-20 DIAGNOSIS — R350 Frequency of micturition: Secondary | ICD-10-CM | POA: Diagnosis not present

## 2018-02-20 DIAGNOSIS — N401 Enlarged prostate with lower urinary tract symptoms: Secondary | ICD-10-CM | POA: Diagnosis not present

## 2018-02-20 DIAGNOSIS — R310 Gross hematuria: Secondary | ICD-10-CM | POA: Diagnosis not present

## 2018-03-20 ENCOUNTER — Other Ambulatory Visit: Payer: Self-pay | Admitting: Dermatology

## 2018-03-20 DIAGNOSIS — D229 Melanocytic nevi, unspecified: Secondary | ICD-10-CM | POA: Diagnosis not present

## 2018-03-20 DIAGNOSIS — C44319 Basal cell carcinoma of skin of other parts of face: Secondary | ICD-10-CM | POA: Diagnosis not present

## 2018-03-20 DIAGNOSIS — D224 Melanocytic nevi of scalp and neck: Secondary | ICD-10-CM | POA: Diagnosis not present

## 2018-03-20 DIAGNOSIS — D044 Carcinoma in situ of skin of scalp and neck: Secondary | ICD-10-CM | POA: Diagnosis not present

## 2018-03-20 DIAGNOSIS — D0439 Carcinoma in situ of skin of other parts of face: Secondary | ICD-10-CM | POA: Diagnosis not present

## 2018-03-20 DIAGNOSIS — L570 Actinic keratosis: Secondary | ICD-10-CM | POA: Diagnosis not present

## 2018-04-12 DIAGNOSIS — N400 Enlarged prostate without lower urinary tract symptoms: Secondary | ICD-10-CM | POA: Diagnosis not present

## 2018-04-12 DIAGNOSIS — Z0000 Encounter for general adult medical examination without abnormal findings: Secondary | ICD-10-CM | POA: Diagnosis not present

## 2018-04-12 DIAGNOSIS — E7800 Pure hypercholesterolemia, unspecified: Secondary | ICD-10-CM | POA: Diagnosis not present

## 2018-04-12 DIAGNOSIS — E669 Obesity, unspecified: Secondary | ICD-10-CM | POA: Diagnosis not present

## 2018-04-12 DIAGNOSIS — E1169 Type 2 diabetes mellitus with other specified complication: Secondary | ICD-10-CM | POA: Diagnosis not present

## 2018-04-12 DIAGNOSIS — Z6834 Body mass index (BMI) 34.0-34.9, adult: Secondary | ICD-10-CM | POA: Diagnosis not present

## 2018-04-12 DIAGNOSIS — I10 Essential (primary) hypertension: Secondary | ICD-10-CM | POA: Diagnosis not present

## 2018-04-12 DIAGNOSIS — K5900 Constipation, unspecified: Secondary | ICD-10-CM | POA: Diagnosis not present

## 2018-04-12 DIAGNOSIS — Z1389 Encounter for screening for other disorder: Secondary | ICD-10-CM | POA: Diagnosis not present

## 2018-04-27 DIAGNOSIS — C44319 Basal cell carcinoma of skin of other parts of face: Secondary | ICD-10-CM | POA: Diagnosis not present

## 2018-06-15 DIAGNOSIS — C44319 Basal cell carcinoma of skin of other parts of face: Secondary | ICD-10-CM | POA: Diagnosis not present

## 2018-06-15 DIAGNOSIS — D044 Carcinoma in situ of skin of scalp and neck: Secondary | ICD-10-CM | POA: Diagnosis not present

## 2018-08-22 DIAGNOSIS — H52203 Unspecified astigmatism, bilateral: Secondary | ICD-10-CM | POA: Diagnosis not present

## 2018-08-22 DIAGNOSIS — H2513 Age-related nuclear cataract, bilateral: Secondary | ICD-10-CM | POA: Diagnosis not present

## 2018-08-22 DIAGNOSIS — H524 Presbyopia: Secondary | ICD-10-CM | POA: Diagnosis not present

## 2018-10-19 DIAGNOSIS — I10 Essential (primary) hypertension: Secondary | ICD-10-CM | POA: Diagnosis not present

## 2018-10-19 DIAGNOSIS — E1169 Type 2 diabetes mellitus with other specified complication: Secondary | ICD-10-CM | POA: Diagnosis not present

## 2018-10-19 DIAGNOSIS — Z23 Encounter for immunization: Secondary | ICD-10-CM | POA: Diagnosis not present

## 2018-12-25 ENCOUNTER — Other Ambulatory Visit: Payer: Self-pay | Admitting: Dermatology

## 2018-12-25 DIAGNOSIS — D2239 Melanocytic nevi of other parts of face: Secondary | ICD-10-CM | POA: Diagnosis not present

## 2018-12-25 DIAGNOSIS — L821 Other seborrheic keratosis: Secondary | ICD-10-CM | POA: Diagnosis not present

## 2018-12-25 DIAGNOSIS — L570 Actinic keratosis: Secondary | ICD-10-CM | POA: Diagnosis not present

## 2018-12-25 DIAGNOSIS — D485 Neoplasm of uncertain behavior of skin: Secondary | ICD-10-CM | POA: Diagnosis not present

## 2019-02-26 DIAGNOSIS — R310 Gross hematuria: Secondary | ICD-10-CM | POA: Diagnosis not present

## 2019-02-26 DIAGNOSIS — N401 Enlarged prostate with lower urinary tract symptoms: Secondary | ICD-10-CM | POA: Diagnosis not present

## 2019-02-26 DIAGNOSIS — R351 Nocturia: Secondary | ICD-10-CM | POA: Diagnosis not present

## 2019-03-01 ENCOUNTER — Ambulatory Visit: Payer: PPO | Attending: Internal Medicine

## 2019-03-01 DIAGNOSIS — Z23 Encounter for immunization: Secondary | ICD-10-CM | POA: Insufficient documentation

## 2019-03-01 NOTE — Progress Notes (Signed)
   Covid-19 Vaccination Clinic  Name:  Aaron Good    MRN: [id number] DOB: 10-Feb-1933  03/01/2019  Mr. Aaron Good was observed post Covid-19 immunization for 15 minutes without incidence. He was provided with Vaccine Information Sheet and instruction to access the V-Safe system.   Mr. Aaron Good was instructed to call 911 with any severe reactions post vaccine: Marland Kitchen Difficulty breathing  . Swelling of your face and throat  . A fast heartbeat  . A bad rash all over your body  . Dizziness and weakness    Immunizations Administered    Name Date Dose VIS Date Route   Pfizer COVID-19 Vaccine 03/01/2019  4:11 PM 0.3 mL 01/19/2019 Intramuscular   Manufacturer: [place]   Lot: [id number]   [place]: [id number]

## 2019-03-22 ENCOUNTER — Ambulatory Visit: Payer: PPO | Attending: Internal Medicine

## 2019-03-22 DIAGNOSIS — Z23 Encounter for immunization: Secondary | ICD-10-CM | POA: Insufficient documentation

## 2019-03-22 NOTE — Progress Notes (Signed)
   Covid-19 Vaccination Clinic  Name:  Aaron Good    MRN: [id number] DOB: 23-Oct-1933  03/22/2019  [name] was observed post Covid-19 immunization for 15 minutes without incidence. He was provided with Vaccine Information Sheet and instruction to access the V-Safe system.   [name] was instructed to call 911 with any severe reactions post vaccine: Marland Kitchen Difficulty breathing  . Swelling of your face and throat  . A fast heartbeat  . A bad rash all over your body  . Dizziness and weakness    Immunizations Administered    Name Date Dose VIS Date Route   Pfizer COVID-19 Vaccine 03/22/2019 11:17 AM 0.3 mL 01/19/2019 Intramuscular   Manufacturer: [employer]   Lot: [id number]   [place]: [id number]

## 2019-04-23 DIAGNOSIS — Z1389 Encounter for screening for other disorder: Secondary | ICD-10-CM | POA: Diagnosis not present

## 2019-04-23 DIAGNOSIS — Z0000 Encounter for general adult medical examination without abnormal findings: Secondary | ICD-10-CM | POA: Diagnosis not present

## 2019-04-23 DIAGNOSIS — E0869 Diabetes mellitus due to underlying condition with other specified complication: Secondary | ICD-10-CM | POA: Diagnosis not present

## 2019-04-23 DIAGNOSIS — N400 Enlarged prostate without lower urinary tract symptoms: Secondary | ICD-10-CM | POA: Diagnosis not present

## 2019-04-23 DIAGNOSIS — I10 Essential (primary) hypertension: Secondary | ICD-10-CM | POA: Diagnosis not present

## 2019-04-23 DIAGNOSIS — I872 Venous insufficiency (chronic) (peripheral): Secondary | ICD-10-CM | POA: Diagnosis not present

## 2019-04-23 DIAGNOSIS — E7800 Pure hypercholesterolemia, unspecified: Secondary | ICD-10-CM | POA: Diagnosis not present

## 2019-05-15 DIAGNOSIS — R310 Gross hematuria: Secondary | ICD-10-CM | POA: Diagnosis not present

## 2019-05-21 DIAGNOSIS — R319 Hematuria, unspecified: Secondary | ICD-10-CM | POA: Diagnosis not present

## 2019-08-23 DIAGNOSIS — H52203 Unspecified astigmatism, bilateral: Secondary | ICD-10-CM | POA: Diagnosis not present

## 2019-08-23 DIAGNOSIS — H524 Presbyopia: Secondary | ICD-10-CM | POA: Diagnosis not present

## 2019-08-23 DIAGNOSIS — H25813 Combined forms of age-related cataract, bilateral: Secondary | ICD-10-CM | POA: Diagnosis not present

## 2019-09-19 DIAGNOSIS — H25011 Cortical age-related cataract, right eye: Secondary | ICD-10-CM | POA: Diagnosis not present

## 2019-09-19 DIAGNOSIS — H2511 Age-related nuclear cataract, right eye: Secondary | ICD-10-CM | POA: Diagnosis not present

## 2019-09-19 DIAGNOSIS — H25812 Combined forms of age-related cataract, left eye: Secondary | ICD-10-CM | POA: Diagnosis not present

## 2019-09-26 DIAGNOSIS — H2512 Age-related nuclear cataract, left eye: Secondary | ICD-10-CM | POA: Diagnosis not present

## 2019-09-26 DIAGNOSIS — H25012 Cortical age-related cataract, left eye: Secondary | ICD-10-CM | POA: Diagnosis not present

## 2019-10-25 DIAGNOSIS — U071 COVID-19: Secondary | ICD-10-CM | POA: Diagnosis not present

## 2019-10-25 DIAGNOSIS — Z20822 Contact with and (suspected) exposure to covid-19: Secondary | ICD-10-CM | POA: Diagnosis not present

## 2019-10-29 DIAGNOSIS — U071 COVID-19: Secondary | ICD-10-CM | POA: Diagnosis not present

## 2019-11-09 DIAGNOSIS — E1169 Type 2 diabetes mellitus with other specified complication: Secondary | ICD-10-CM | POA: Diagnosis not present

## 2019-11-09 DIAGNOSIS — I10 Essential (primary) hypertension: Secondary | ICD-10-CM | POA: Diagnosis not present

## 2019-12-10 DIAGNOSIS — Z1211 Encounter for screening for malignant neoplasm of colon: Secondary | ICD-10-CM | POA: Diagnosis not present

## 2019-12-10 DIAGNOSIS — D126 Benign neoplasm of colon, unspecified: Secondary | ICD-10-CM | POA: Diagnosis not present

## 2019-12-10 DIAGNOSIS — K5900 Constipation, unspecified: Secondary | ICD-10-CM | POA: Diagnosis not present

## 2019-12-19 DIAGNOSIS — Z1211 Encounter for screening for malignant neoplasm of colon: Secondary | ICD-10-CM | POA: Diagnosis not present

## 2020-01-24 DIAGNOSIS — M9905 Segmental and somatic dysfunction of pelvic region: Secondary | ICD-10-CM | POA: Diagnosis not present

## 2020-01-24 DIAGNOSIS — M9903 Segmental and somatic dysfunction of lumbar region: Secondary | ICD-10-CM | POA: Diagnosis not present

## 2020-01-24 DIAGNOSIS — M9902 Segmental and somatic dysfunction of thoracic region: Secondary | ICD-10-CM | POA: Diagnosis not present

## 2020-01-24 DIAGNOSIS — S335XXA Sprain of ligaments of lumbar spine, initial encounter: Secondary | ICD-10-CM | POA: Diagnosis not present

## 2020-01-28 DIAGNOSIS — M9902 Segmental and somatic dysfunction of thoracic region: Secondary | ICD-10-CM | POA: Diagnosis not present

## 2020-01-28 DIAGNOSIS — M9905 Segmental and somatic dysfunction of pelvic region: Secondary | ICD-10-CM | POA: Diagnosis not present

## 2020-01-28 DIAGNOSIS — S335XXA Sprain of ligaments of lumbar spine, initial encounter: Secondary | ICD-10-CM | POA: Diagnosis not present

## 2020-01-28 DIAGNOSIS — M9903 Segmental and somatic dysfunction of lumbar region: Secondary | ICD-10-CM | POA: Diagnosis not present

## 2020-02-28 DIAGNOSIS — R351 Nocturia: Secondary | ICD-10-CM | POA: Diagnosis not present

## 2020-02-28 DIAGNOSIS — N401 Enlarged prostate with lower urinary tract symptoms: Secondary | ICD-10-CM | POA: Diagnosis not present

## 2020-02-28 DIAGNOSIS — R310 Gross hematuria: Secondary | ICD-10-CM | POA: Diagnosis not present

## 2020-04-11 DIAGNOSIS — Z20822 Contact with and (suspected) exposure to covid-19: Secondary | ICD-10-CM | POA: Diagnosis not present

## 2020-04-11 DIAGNOSIS — Z03818 Encounter for observation for suspected exposure to other biological agents ruled out: Secondary | ICD-10-CM | POA: Diagnosis not present

## 2020-04-24 DIAGNOSIS — Z1389 Encounter for screening for other disorder: Secondary | ICD-10-CM | POA: Diagnosis not present

## 2020-04-24 DIAGNOSIS — E119 Type 2 diabetes mellitus without complications: Secondary | ICD-10-CM | POA: Diagnosis not present

## 2020-04-24 DIAGNOSIS — E7800 Pure hypercholesterolemia, unspecified: Secondary | ICD-10-CM | POA: Diagnosis not present

## 2020-04-24 DIAGNOSIS — Z0000 Encounter for general adult medical examination without abnormal findings: Secondary | ICD-10-CM | POA: Diagnosis not present

## 2020-04-24 DIAGNOSIS — N400 Enlarged prostate without lower urinary tract symptoms: Secondary | ICD-10-CM | POA: Diagnosis not present

## 2020-04-24 DIAGNOSIS — Z6835 Body mass index (BMI) 35.0-35.9, adult: Secondary | ICD-10-CM | POA: Diagnosis not present

## 2020-04-24 DIAGNOSIS — I10 Essential (primary) hypertension: Secondary | ICD-10-CM | POA: Diagnosis not present

## 2020-04-24 DIAGNOSIS — D126 Benign neoplasm of colon, unspecified: Secondary | ICD-10-CM | POA: Diagnosis not present

## 2020-08-08 DIAGNOSIS — C4492 Squamous cell carcinoma of skin, unspecified: Secondary | ICD-10-CM

## 2020-08-08 HISTORY — DX: Squamous cell carcinoma of skin, unspecified: C44.92

## 2020-08-19 ENCOUNTER — Other Ambulatory Visit: Payer: Self-pay

## 2020-08-19 ENCOUNTER — Ambulatory Visit: Payer: PPO | Admitting: Dermatology

## 2020-08-19 DIAGNOSIS — D044 Carcinoma in situ of skin of scalp and neck: Secondary | ICD-10-CM | POA: Diagnosis not present

## 2020-08-19 DIAGNOSIS — D485 Neoplasm of uncertain behavior of skin: Secondary | ICD-10-CM

## 2020-08-19 DIAGNOSIS — L570 Actinic keratosis: Secondary | ICD-10-CM

## 2020-08-19 DIAGNOSIS — D0439 Carcinoma in situ of skin of other parts of face: Secondary | ICD-10-CM

## 2020-08-27 ENCOUNTER — Telehealth: Payer: Self-pay | Admitting: *Deleted

## 2020-08-27 NOTE — Telephone Encounter (Signed)
Left message for patient to call us back.  

## 2020-08-27 NOTE — Telephone Encounter (Signed)
Pathology to patient-surgery appointment scheduled.  

## 2020-08-31 ENCOUNTER — Encounter: Payer: Self-pay | Admitting: Dermatology

## 2020-08-31 NOTE — Progress Notes (Signed)
Follow-Up Visit   Subjective  Aaron Good is a 87 y.o. male who presents for the following: Annual Exam (Here for check up. Patient has some scaly lesions on his scalp/ forehead he wants checked. Has a history of non mole skin cancers and mohs surgery. ).  Notable new crusts in a gentleman with history of nonmelanoma skin cancer Location:  Duration:  Quality:  Associated Signs/Symptoms: Modifying Factors:  Severity:  Timing: Context:   Objective  Well appearing patient in no apparent distress; mood and affect are within normal limits. Left Forehead 1.2 cm waxy crust, probable CIS     Mid Frontal Scalp 1.0 cm waxy crust, probable CIS     Left Frontal Scalp 1.3 cm waxy crust, probable CIS     Right Buccal Cheek 8 mm hornlike crust, rule out CIS     Mid Frontal Scalp (4), Right Ear Multiple moderately thick gritty pink crusts    All skin waist up examined.   Assessment & Plan    Neoplasm of uncertain behavior of skin (4) Left Forehead  Skin / nail biopsy Type of biopsy: tangential   Informed consent: discussed and consent obtained   Timeout: patient name, date of birth, surgical site, and procedure verified   Anesthesia: the lesion was anesthetized in a standard fashion   Anesthetic:  1% lidocaine w/ epinephrine 1-100,000 local infiltration Instrument used: flexible razor blade   Hemostasis achieved with: aluminum chloride and electrodesiccation   Outcome: patient tolerated procedure well   Post-procedure details: wound care instructions given    Specimen 1 - Surgical pathology Differential Diagnosis: scc vs bcc  Check Margins: No  Mid Frontal Scalp  Skin / nail biopsy Type of biopsy: tangential   Informed consent: discussed and consent obtained   Timeout: patient name, date of birth, surgical site, and procedure verified   Anesthesia: the lesion was anesthetized in a standard fashion   Anesthetic:  1% lidocaine w/ epinephrine 1-100,000  local infiltration Instrument used: flexible razor blade   Hemostasis achieved with: aluminum chloride and electrodesiccation   Outcome: patient tolerated procedure well   Post-procedure details: wound care instructions given    Specimen 2 - Surgical pathology Differential Diagnosis: scc vs bcc  Check Margins: No  Left Frontal Scalp  Skin / nail biopsy Type of biopsy: tangential   Informed consent: discussed and consent obtained   Timeout: patient name, date of birth, surgical site, and procedure verified   Anesthesia: the lesion was anesthetized in a standard fashion   Anesthetic:  1% lidocaine w/ epinephrine 1-100,000 local infiltration Instrument used: flexible razor blade   Hemostasis achieved with: aluminum chloride and electrodesiccation   Outcome: patient tolerated procedure well   Post-procedure details: wound care instructions given    Specimen 3 - Surgical pathology Differential Diagnosis: scc vs bcc  Check Margins: No  Right Buccal Cheek  Skin / nail biopsy Type of biopsy: tangential   Informed consent: discussed and consent obtained   Timeout: patient name, date of birth, surgical site, and procedure verified   Anesthesia: the lesion was anesthetized in a standard fashion   Anesthetic:  1% lidocaine w/ epinephrine 1-100,000 local infiltration Instrument used: flexible razor blade   Hemostasis achieved with: aluminum chloride and electrodesiccation   Outcome: patient tolerated procedure well   Post-procedure details: wound care instructions given    Specimen 4 - Surgical pathology Differential Diagnosis: scc vs bcc  Check Margins: No  AK (actinic keratosis) (5) Right Ear; Mid Frontal Scalp (4)  Will consider red light PDT in the late fall.  Destruction of lesion - Mid Frontal Scalp, Right Ear Complexity: simple   Destruction method: cryotherapy   Informed consent: discussed and consent obtained   Timeout:  patient name, date of birth, surgical site,  and procedure verified Lesion destroyed using liquid nitrogen: Yes   Cryotherapy cycles:  1 Outcome: patient tolerated procedure well with no complications   Post-procedure details: wound care instructions given        I, Lavonna Monarch, MD, have reviewed all documentation for this visit.  The documentation on 08/31/20 for the exam, diagnosis, procedures, and orders are all accurate and complete.

## 2020-10-28 DIAGNOSIS — U099 Post covid-19 condition, unspecified: Secondary | ICD-10-CM | POA: Diagnosis not present

## 2020-10-28 DIAGNOSIS — N400 Enlarged prostate without lower urinary tract symptoms: Secondary | ICD-10-CM | POA: Diagnosis not present

## 2020-10-28 DIAGNOSIS — I10 Essential (primary) hypertension: Secondary | ICD-10-CM | POA: Diagnosis not present

## 2020-10-28 DIAGNOSIS — E1169 Type 2 diabetes mellitus with other specified complication: Secondary | ICD-10-CM | POA: Diagnosis not present

## 2020-11-05 ENCOUNTER — Ambulatory Visit (INDEPENDENT_AMBULATORY_CARE_PROVIDER_SITE_OTHER): Payer: PPO | Admitting: Dermatology

## 2020-11-05 ENCOUNTER — Encounter: Payer: Self-pay | Admitting: Dermatology

## 2020-11-05 ENCOUNTER — Other Ambulatory Visit: Payer: Self-pay

## 2020-11-05 DIAGNOSIS — C4442 Squamous cell carcinoma of skin of scalp and neck: Secondary | ICD-10-CM

## 2020-11-05 DIAGNOSIS — C4492 Squamous cell carcinoma of skin, unspecified: Secondary | ICD-10-CM

## 2020-11-05 DIAGNOSIS — L988 Other specified disorders of the skin and subcutaneous tissue: Secondary | ICD-10-CM | POA: Diagnosis not present

## 2020-11-05 NOTE — Patient Instructions (Signed)

## 2020-11-12 ENCOUNTER — Ambulatory Visit (INDEPENDENT_AMBULATORY_CARE_PROVIDER_SITE_OTHER): Payer: PPO

## 2020-11-12 ENCOUNTER — Other Ambulatory Visit: Payer: Self-pay

## 2020-11-12 DIAGNOSIS — Z4802 Encounter for removal of sutures: Secondary | ICD-10-CM

## 2020-11-12 NOTE — Progress Notes (Signed)
NTS Suture removal, No s/s of infection, path to pt. 

## 2020-11-22 ENCOUNTER — Encounter: Payer: Self-pay | Admitting: Dermatology

## 2020-11-22 NOTE — Progress Notes (Signed)
   Follow-Up Visit   Subjective  Aaron Good is a 87 y.o. male who presents for the following: Procedure (Here for treatment- left forehead, mid frontal scalp, left frontal scalp, right buccal cheek- scc x 4).  SCCA left scalp Location:  Duration:  Quality:  Associated Signs/Symptoms: Modifying Factors:  Severity:  Timing: Context:   Objective  Well appearing patient in no apparent distress; mood and affect are within normal limits. Left Frontal Scalp Lesion identified by Dr.Sheryll Dymek and nurse in room.      A focused examination was performed including head and neck.. Relevant physical exam findings are noted in the Assessment and Plan.   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  Skin excision  Total excision diameter (cm):  1.5 Informed consent: discussed and consent obtained   Timeout: patient name, date of birth, surgical site, and procedure verified   Anesthesia: the lesion was anesthetized in a standard fashion   Anesthetic:  1% lidocaine w/ epinephrine 1-100,000 local infiltration Instrument used: #15 blade   Hemostasis achieved with: pressure and electrodesiccation   Outcome: patient tolerated procedure well with no complications   Post-procedure details: sterile dressing applied and wound care instructions given   Dressing type: bandage, petrolatum and pressure dressing    Destruction of lesion Complexity: simple   Destruction method: electrodesiccation and curettage   Informed consent: discussed and consent obtained   Timeout:  patient name, date of birth, surgical site, and procedure verified Anesthesia: the lesion was anesthetized in a standard fashion   Anesthetic:  1% lidocaine w/ epinephrine 1-100,000 local infiltration Curettage performed in three different directions: Yes   Electrodesiccation performed over the curetted area: Yes   Curettage cycles:  3 Lesion length (cm):  1.5 Lesion width (cm):  1.5 Margin per  side (cm):  0.1 Final wound size (cm):  1.7 Hemostasis achieved with:  aluminum chloride Outcome: patient tolerated procedure well with no complications   Post-procedure details: wound care instructions given    Skin excision  Lesion length (cm):  2.2 Lesion width (cm):  1.5 Margin per side (cm):  0.1 Total excision diameter (cm):  2.4 Informed consent: discussed and consent obtained   Timeout: patient name, date of birth, surgical site, and procedure verified   Procedure prep:  Patient was prepped and draped in usual sterile fashion Prep type:  Chlorhexidine Instrument used: #15 blade   Hemostasis achieved with: suture   Outcome: patient tolerated procedure well with no complications   Post-procedure details: sterile dressing applied   Dressing type: petrolatum    Specimen 1 - Surgical pathology Differential Diagnosis:scc v/s ka medial margin stained  [id number]   Check Margins: yes   Treat left frontal scalp only today with excision and make another 30 minute surgery to complete treatment on left forehead, mid frontal scalp, right buccal cheek.  Curettage of left frontal scalp lesion showed deep extension; base and margins cauterized and we curetted followed by narrow margin excision and simple closure.      I, Lavonna Monarch, MD, have reviewed all documentation for this visit.  The documentation on 11/22/20 for the exam, diagnosis, procedures, and orders are all accurate and complete.

## 2020-11-25 DIAGNOSIS — Z20822 Contact with and (suspected) exposure to covid-19: Secondary | ICD-10-CM | POA: Diagnosis not present

## 2020-11-25 NOTE — Addendum Note (Signed)
Addended by: Lavonna Monarch on: 11/25/2020 05:12 PM   Modules accepted: Orders

## 2021-01-08 ENCOUNTER — Encounter: Payer: Self-pay | Admitting: Dermatology

## 2021-01-08 ENCOUNTER — Other Ambulatory Visit: Payer: Self-pay

## 2021-01-08 ENCOUNTER — Ambulatory Visit (INDEPENDENT_AMBULATORY_CARE_PROVIDER_SITE_OTHER): Payer: PPO | Admitting: Dermatology

## 2021-01-08 DIAGNOSIS — D049 Carcinoma in situ of skin, unspecified: Secondary | ICD-10-CM

## 2021-01-08 DIAGNOSIS — D044 Carcinoma in situ of skin of scalp and neck: Secondary | ICD-10-CM | POA: Diagnosis not present

## 2021-01-08 DIAGNOSIS — D0439 Carcinoma in situ of skin of other parts of face: Secondary | ICD-10-CM | POA: Diagnosis not present

## 2021-01-08 NOTE — Patient Instructions (Signed)

## 2021-01-20 DIAGNOSIS — H524 Presbyopia: Secondary | ICD-10-CM | POA: Diagnosis not present

## 2021-01-20 DIAGNOSIS — H52203 Unspecified astigmatism, bilateral: Secondary | ICD-10-CM | POA: Diagnosis not present

## 2021-01-20 DIAGNOSIS — E119 Type 2 diabetes mellitus without complications: Secondary | ICD-10-CM | POA: Diagnosis not present

## 2021-01-20 DIAGNOSIS — Z961 Presence of intraocular lens: Secondary | ICD-10-CM | POA: Diagnosis not present

## 2021-01-27 ENCOUNTER — Encounter: Payer: Self-pay | Admitting: Dermatology

## 2021-01-27 NOTE — Progress Notes (Signed)
° °  Follow-Up Visit   Subjective  Aaron Good is a 87 y.o. male who presents for the following: Procedure (Patient here today for treatment of CIS x 3 left forehead, mid frontal scalp and right buccal cheek. ).  Multiple biopsy-proven in situ carcinomas face and scalp Location:  Duration:  Quality:  Associated Signs/Symptoms: Modifying Factors:  Severity:  Timing: Context:   Objective  Well appearing patient in no apparent distress; mood and affect are within normal limits. Right Buccal Cheek Lesion identified by nurse and Dr.Chiniqua Kilcrease in room.  [id number]  Mid Frontal Scalp Lesion identified by nurse and Dr.Jaclene Bartelt in room.  [id number]  Left Frontal Scalp Lesion identified by nurse and Dr.Skylor Hughson in room.  [id number]    A focused examination was performed including head and neck. Relevant physical exam findings are noted in the Assessment and Plan.   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) Right Buccal Cheek  Destruction of lesion Complexity: simple   Destruction method: electrodesiccation and curettage   Informed consent: discussed and consent obtained   Timeout:  patient name, date of birth, surgical site, and procedure verified Anesthesia: the lesion was anesthetized in a standard fashion   Anesthetic:  1% lidocaine w/ epinephrine 1-100,000 local infiltration Curettage performed in three different directions: Yes   Curettage cycles:  3 Lesion length (cm):  1.2 Lesion width (cm):  1.2 Margin per side (cm):  0 Final wound size (cm):  1.2 Hemostasis achieved with:  ferric subsulfate Outcome: patient tolerated procedure well with no complications   Additional details:  Wound innoculated with 5 fluorouracil solution.  Mid Frontal Scalp  Destruction of lesion Complexity: simple   Destruction method: electrodesiccation and curettage   Informed consent: discussed and consent obtained   Timeout:  patient name, date of birth,  surgical site, and procedure verified Anesthesia: the lesion was anesthetized in a standard fashion   Anesthetic:  1% lidocaine w/ epinephrine 1-100,000 local infiltration Curettage performed in three different directions: Yes   Curettage cycles:  3 Lesion length (cm):  1 Lesion width (cm):  1 Margin per side (cm):  0 Final wound size (cm):  1 Hemostasis achieved with:  ferric subsulfate Outcome: patient tolerated procedure well with no complications   Additional details:  Wound innoculated with 5 fluorouracil solution.  Left Frontal Scalp  Destruction of lesion Complexity: simple   Destruction method: electrodesiccation and curettage   Informed consent: discussed and consent obtained   Timeout:  patient name, date of birth, surgical site, and procedure verified Anesthesia: the lesion was anesthetized in a standard fashion   Anesthetic:  1% lidocaine w/ epinephrine 1-100,000 local infiltration Curettage performed in three different directions: Yes   Curettage cycles:  3 Lesion length (cm):  0.5 Lesion width (cm):  0.5 Margin per side (cm):  0 Final wound size (cm):  0.5 Hemostasis achieved with:  ferric subsulfate Outcome: patient tolerated procedure well with no complications   Additional details:  Wound innoculated with 5 fluorouracil solution.      I, Lavonna Monarch, MD, have reviewed all documentation for this visit.  The documentation on 01/27/21 for the exam, diagnosis, procedures, and orders are all accurate and complete.

## 2021-02-18 ENCOUNTER — Ambulatory Visit (INDEPENDENT_AMBULATORY_CARE_PROVIDER_SITE_OTHER): Payer: PPO | Admitting: Dermatology

## 2021-02-18 ENCOUNTER — Other Ambulatory Visit: Payer: Self-pay

## 2021-02-18 DIAGNOSIS — L570 Actinic keratosis: Secondary | ICD-10-CM

## 2021-02-18 MED ORDER — AMINOLEVULINIC ACID HCL 10 % EX GEL
2000.0000 mg | Freq: Once | CUTANEOUS | Status: AC
  Administered 2021-02-18: 2000 mg via TOPICAL

## 2021-02-18 NOTE — Patient Instructions (Signed)

## 2021-03-02 DIAGNOSIS — R351 Nocturia: Secondary | ICD-10-CM | POA: Diagnosis not present

## 2021-03-02 DIAGNOSIS — N401 Enlarged prostate with lower urinary tract symptoms: Secondary | ICD-10-CM | POA: Diagnosis not present

## 2021-03-14 ENCOUNTER — Encounter: Payer: Self-pay | Admitting: Dermatology

## 2021-03-14 NOTE — Progress Notes (Signed)
° °  Follow-Up Visit   Subjective  Conal Shetley is a 88 y.o. male who presents for the following: Procedure (Here for PDT ).  Multiple thick actinic keratoses in a patient with a history of multiple skin cancers Location:  Duration:  Quality:  Associated Signs/Symptoms: Modifying Factors:  Severity:  Timing: Context:   Objective  Well appearing patient in no apparent distress; mood and affect are within normal limits. Left Parotid Area, Mid Parietal Scalp (3), Right Parotid Area Diffuse actinic keratoses with multiple hypertrophic lesions, history of multiple scalp skin cancers.  Several hypertrophic lesions outside of the field which will be covered by the PDT light will be treated with LN2 freeze.    A focused examination was performed including head and neck.. Relevant physical exam findings are noted in the Assessment and Plan.   Assessment & Plan    AK (actinic keratosis) (5) Mid Parietal Scalp (3); Left Parotid Area; Right Parotid Area  Photodynamic therapy - Mid Parietal Scalp Procedure discussed: discussed risks, benefits, side effects. and alternatives   Prep: site scrubbed/prepped with acetone   Debridement needed: Yes (done by dr Rayshell Goecke)   Location: scalp and forehead. Number of lesions:  2 Type of treatment:  Red light Aminolevulinic Acid (see MAR for details): Ameluz Amount of Ameluz (mg):  2000 Incubation time (minutes):  90 Number of minutes under lamp:  10 Cooling:  Fan Post-procedure details: sunscreen applied and aftercare instructions given to patient    Aminolevulinic Acid HCl 10 % GEL 2,000 mg - Mid Parietal Scalp   Destruction of lesion - Mid Parietal Scalp Complexity: simple   Destruction method: cryotherapy   Informed consent: discussed and consent obtained   Timeout:  patient name, date of birth, surgical site, and procedure verified Lesion destroyed using liquid nitrogen: Yes   Cryotherapy cycles:  3 Outcome: patient tolerated procedure  well with no complications        I, Lavonna Monarch, MD, have reviewed all documentation for this visit.  The documentation on 03/14/21 for the exam, diagnosis, procedures, and orders are all accurate and complete.

## 2021-04-14 DIAGNOSIS — Z20822 Contact with and (suspected) exposure to covid-19: Secondary | ICD-10-CM | POA: Diagnosis not present

## 2021-05-12 ENCOUNTER — Ambulatory Visit: Payer: PPO | Admitting: Dermatology

## 2021-05-12 ENCOUNTER — Encounter: Payer: Self-pay | Admitting: Dermatology

## 2021-05-12 DIAGNOSIS — L570 Actinic keratosis: Secondary | ICD-10-CM

## 2021-05-13 DIAGNOSIS — N400 Enlarged prostate without lower urinary tract symptoms: Secondary | ICD-10-CM | POA: Diagnosis not present

## 2021-05-13 DIAGNOSIS — E0869 Diabetes mellitus due to underlying condition with other specified complication: Secondary | ICD-10-CM | POA: Diagnosis not present

## 2021-05-13 DIAGNOSIS — Z0000 Encounter for general adult medical examination without abnormal findings: Secondary | ICD-10-CM | POA: Diagnosis not present

## 2021-05-13 DIAGNOSIS — Z1389 Encounter for screening for other disorder: Secondary | ICD-10-CM | POA: Diagnosis not present

## 2021-05-13 DIAGNOSIS — E7800 Pure hypercholesterolemia, unspecified: Secondary | ICD-10-CM | POA: Diagnosis not present

## 2021-05-13 DIAGNOSIS — I10 Essential (primary) hypertension: Secondary | ICD-10-CM | POA: Diagnosis not present

## 2021-05-13 DIAGNOSIS — I872 Venous insufficiency (chronic) (peripheral): Secondary | ICD-10-CM | POA: Diagnosis not present

## 2021-06-01 ENCOUNTER — Encounter: Payer: Self-pay | Admitting: Dermatology

## 2021-06-01 NOTE — Progress Notes (Signed)
? ?  Follow-Up Visit ?  ?Subjective  ?Aaron Good is a 88 y.o. male who presents for the following: Follow-up (F/u for pdt. ). ? ?Actinic keratoses scalp, follow-up after PDT ?Location:  ?Duration:  ?Quality:  ?Associated Signs/Symptoms: ?Modifying Factors:  ?Severity:  ?Timing: ?Context:  ? ?Objective  ?Well appearing patient in no apparent distress; mood and affect are within normal limits. ?Scalp ?Perhaps 50% clearance of diffuse actinic keratoses scalp.  No uncovered or recurrent nonmelanoma skin cancer. ? ? ? ?A focused examination was performed including the neck.. Relevant physical exam findings are noted in the Assessment and Plan. ? ? ?Assessment & Plan  ? ? ?AK (actinic keratosis) ?Scalp ? ?Of this late fall or winter we will consider repeat pdt or [place] for x 1 month  ? ? ? ? ? ?I, Lavonna Monarch, MD, have reviewed all documentation for this visit.  The documentation on 06/01/21 for the exam, diagnosis, procedures, and orders are all accurate and complete. ?

## 2021-11-10 DIAGNOSIS — M9903 Segmental and somatic dysfunction of lumbar region: Secondary | ICD-10-CM | POA: Diagnosis not present

## 2021-11-10 DIAGNOSIS — M4604 Spinal enthesopathy, thoracic region: Secondary | ICD-10-CM | POA: Diagnosis not present

## 2021-11-10 DIAGNOSIS — M9902 Segmental and somatic dysfunction of thoracic region: Secondary | ICD-10-CM | POA: Diagnosis not present

## 2021-11-10 DIAGNOSIS — M9905 Segmental and somatic dysfunction of pelvic region: Secondary | ICD-10-CM | POA: Diagnosis not present

## 2021-11-10 DIAGNOSIS — M5386 Other specified dorsopathies, lumbar region: Secondary | ICD-10-CM | POA: Diagnosis not present

## 2021-11-12 DIAGNOSIS — M9903 Segmental and somatic dysfunction of lumbar region: Secondary | ICD-10-CM | POA: Diagnosis not present

## 2021-11-12 DIAGNOSIS — M9905 Segmental and somatic dysfunction of pelvic region: Secondary | ICD-10-CM | POA: Diagnosis not present

## 2021-11-12 DIAGNOSIS — M4604 Spinal enthesopathy, thoracic region: Secondary | ICD-10-CM | POA: Diagnosis not present

## 2021-11-12 DIAGNOSIS — M9902 Segmental and somatic dysfunction of thoracic region: Secondary | ICD-10-CM | POA: Diagnosis not present

## 2021-11-12 DIAGNOSIS — M5386 Other specified dorsopathies, lumbar region: Secondary | ICD-10-CM | POA: Diagnosis not present

## 2021-11-13 DIAGNOSIS — I872 Venous insufficiency (chronic) (peripheral): Secondary | ICD-10-CM | POA: Diagnosis not present

## 2021-11-13 DIAGNOSIS — E1169 Type 2 diabetes mellitus with other specified complication: Secondary | ICD-10-CM | POA: Diagnosis not present

## 2021-11-13 DIAGNOSIS — K5900 Constipation, unspecified: Secondary | ICD-10-CM | POA: Diagnosis not present

## 2021-11-13 DIAGNOSIS — E7800 Pure hypercholesterolemia, unspecified: Secondary | ICD-10-CM | POA: Diagnosis not present

## 2021-11-13 DIAGNOSIS — Z23 Encounter for immunization: Secondary | ICD-10-CM | POA: Diagnosis not present

## 2021-11-13 DIAGNOSIS — I10 Essential (primary) hypertension: Secondary | ICD-10-CM | POA: Diagnosis not present

## 2021-11-13 DIAGNOSIS — E669 Obesity, unspecified: Secondary | ICD-10-CM | POA: Diagnosis not present

## 2021-11-13 DIAGNOSIS — N400 Enlarged prostate without lower urinary tract symptoms: Secondary | ICD-10-CM | POA: Diagnosis not present

## 2021-11-16 DIAGNOSIS — M9903 Segmental and somatic dysfunction of lumbar region: Secondary | ICD-10-CM | POA: Diagnosis not present

## 2021-11-16 DIAGNOSIS — M9905 Segmental and somatic dysfunction of pelvic region: Secondary | ICD-10-CM | POA: Diagnosis not present

## 2021-11-16 DIAGNOSIS — M4604 Spinal enthesopathy, thoracic region: Secondary | ICD-10-CM | POA: Diagnosis not present

## 2021-11-16 DIAGNOSIS — M9902 Segmental and somatic dysfunction of thoracic region: Secondary | ICD-10-CM | POA: Diagnosis not present

## 2021-11-16 DIAGNOSIS — M5386 Other specified dorsopathies, lumbar region: Secondary | ICD-10-CM | POA: Diagnosis not present

## 2021-11-18 DIAGNOSIS — M9902 Segmental and somatic dysfunction of thoracic region: Secondary | ICD-10-CM | POA: Diagnosis not present

## 2021-11-18 DIAGNOSIS — M4604 Spinal enthesopathy, thoracic region: Secondary | ICD-10-CM | POA: Diagnosis not present

## 2021-11-18 DIAGNOSIS — M9903 Segmental and somatic dysfunction of lumbar region: Secondary | ICD-10-CM | POA: Diagnosis not present

## 2021-11-18 DIAGNOSIS — M9905 Segmental and somatic dysfunction of pelvic region: Secondary | ICD-10-CM | POA: Diagnosis not present

## 2021-11-18 DIAGNOSIS — M5386 Other specified dorsopathies, lumbar region: Secondary | ICD-10-CM | POA: Diagnosis not present

## 2021-11-25 DIAGNOSIS — M9905 Segmental and somatic dysfunction of pelvic region: Secondary | ICD-10-CM | POA: Diagnosis not present

## 2021-11-25 DIAGNOSIS — M9903 Segmental and somatic dysfunction of lumbar region: Secondary | ICD-10-CM | POA: Diagnosis not present

## 2021-11-25 DIAGNOSIS — M4604 Spinal enthesopathy, thoracic region: Secondary | ICD-10-CM | POA: Diagnosis not present

## 2021-11-25 DIAGNOSIS — M9902 Segmental and somatic dysfunction of thoracic region: Secondary | ICD-10-CM | POA: Diagnosis not present

## 2021-11-25 DIAGNOSIS — M5386 Other specified dorsopathies, lumbar region: Secondary | ICD-10-CM | POA: Diagnosis not present

## 2022-01-21 DIAGNOSIS — Z961 Presence of intraocular lens: Secondary | ICD-10-CM | POA: Diagnosis not present

## 2022-01-21 DIAGNOSIS — H52203 Unspecified astigmatism, bilateral: Secondary | ICD-10-CM | POA: Diagnosis not present

## 2022-01-21 DIAGNOSIS — H524 Presbyopia: Secondary | ICD-10-CM | POA: Diagnosis not present

## 2022-02-22 ENCOUNTER — Ambulatory Visit: Payer: PPO | Admitting: Dermatology

## 2022-02-22 DIAGNOSIS — N401 Enlarged prostate with lower urinary tract symptoms: Secondary | ICD-10-CM | POA: Diagnosis not present

## 2022-02-22 DIAGNOSIS — R351 Nocturia: Secondary | ICD-10-CM | POA: Diagnosis not present

## 2022-02-22 DIAGNOSIS — R310 Gross hematuria: Secondary | ICD-10-CM | POA: Diagnosis not present

## 2022-04-22 ENCOUNTER — Other Ambulatory Visit: Payer: Self-pay

## 2022-04-22 ENCOUNTER — Encounter: Payer: Self-pay | Admitting: Dermatology

## 2022-04-22 ENCOUNTER — Ambulatory Visit (INDEPENDENT_AMBULATORY_CARE_PROVIDER_SITE_OTHER): Payer: PPO | Admitting: Dermatology

## 2022-04-22 DIAGNOSIS — L570 Actinic keratosis: Secondary | ICD-10-CM

## 2022-04-22 DIAGNOSIS — D489 Neoplasm of uncertain behavior, unspecified: Secondary | ICD-10-CM | POA: Diagnosis not present

## 2022-04-22 NOTE — Progress Notes (Signed)
New Patient Visit  Subjective  Aaron Good is a 89 y.o. male who presents for the following: Annual Exam (Had a couple Mohs surgery about 4 years ago. Spots on right hand, scalp, and back. Father had skin cancer. Had light therapy).    Objective  Well appearing patient in no apparent distress; mood and affect are within normal limits.  A full examination was performed including scalp, head, eyes, ears, nose, lips, neck, chest, axillae, abdomen, back, buttocks, bilateral upper extremities, bilateral lower extremities, hands, feet, fingers, toes, fingernails, and toenails. All findings within normal limits unless otherwise noted below.  Scalp 1cm Crusted papule. Rule out [place]  Left Shoulder - Anterior 1cm Pink and brown nodule with central crust. Rule out KA  Left Forearm - Posterior, Left side of face (7), Right Forearm - Posterior, Right Hand - Posterior, Scalp Erythematous thin papules/macules with gritty scale.      Assessment & Plan  Neoplasm of uncertain behavior (2) Scalp  Skin / nail biopsy Type of biopsy: tangential   Informed consent: discussed and consent obtained   Timeout: patient name, date of birth, surgical site, and procedure verified   Procedure prep:  Patient was prepped and draped in usual sterile fashion Prep type:  Isopropyl alcohol Anesthesia: the lesion was anesthetized in a standard fashion   Anesthetic:  1% lidocaine w/ epinephrine 1-100,000 buffered w/ 8.4% NaHCO3 Instrument used: DermaBlade   Hemostasis achieved with: aluminum chloride   Outcome: patient tolerated procedure well   Post-procedure details: sterile dressing applied and wound care instructions given   Dressing type: petrolatum and bandage    Left Shoulder - Anterior  Skin / nail biopsy Type of biopsy: tangential   Informed consent: discussed and consent obtained   Timeout: patient name, date of birth, surgical site, and procedure verified   Procedure prep:  Patient was prepped  and draped in usual sterile fashion Prep type:  Isopropyl alcohol Anesthesia: the lesion was anesthetized in a standard fashion   Anesthetic:  1% lidocaine w/ epinephrine 1-100,000 buffered w/ 8.4% NaHCO3 Instrument used: DermaBlade   Hemostasis achieved with: aluminum chloride   Outcome: patient tolerated procedure well   Post-procedure details: sterile dressing applied and wound care instructions given   Dressing type: petrolatum and bandage    AK (actinic keratosis) (11) Left side of face (7); Right Hand - Posterior; Left Forearm - Posterior; Right Forearm - Posterior; Scalp  Destruction of lesion - Left side of face Complexity: simple   Destruction method: cryotherapy   Informed consent: discussed and consent obtained   Timeout:  patient name, date of birth, surgical site, and procedure verified Lesion destroyed using liquid nitrogen: Yes   Region frozen until ice ball extended beyond lesion: Yes   Outcome: patient tolerated procedure well with no complications   Post-procedure details: wound care instructions given     Lentigines - Scattered tan macules - Due to sun exposure - Benign-appearing, observe - Recommend daily broad spectrum sunscreen SPF 30+ to sun-exposed areas, reapply every 2 hours as needed. - Call for any changes  Seborrheic Keratoses - Stuck-on, waxy, tan-brown papules and/or plaques  - Benign-appearing - Discussed benign etiology and prognosis. - Observe - Call for any changes  Melanocytic Nevi - Tan-brown and/or pink-flesh-colored symmetric macules and papules - Benign appearing on exam today - Observation - Call clinic for new or changing moles - Recommend daily use of broad spectrum spf 30+ sunscreen to sun-exposed areas.   Hemangiomas - Red papules -  Discussed benign nature - Observe - Call for any changes  Actinic Damage - Chronic condition, secondary to cumulative UV/sun exposure - diffuse scaly erythematous macules with underlying  dyspigmentation - Recommend daily broad spectrum sunscreen SPF 30+ to sun-exposed areas, reapply every 2 hours as needed.  - Staying in the shade or wearing long sleeves, sun glasses (UVA+UVB protection) and wide brim hats (4-inch brim around the entire circumference of the hat) are also recommended for sun protection.  - Call for new or changing lesions.  Skin cancer screening performed today.   No follow-ups on file.  I, Zigmund Gottron, CMA, am acting as scribe for Ellard Artis, MD.   Documentation: I have reviewed the above documentation for accuracy and completeness, and I agree with the above  [place], DO

## 2022-04-22 NOTE — Patient Instructions (Addendum)
Cryotherapy Aftercare  Wash gently with soap and water everyday.   Apply Vaseline and Band-Aid daily until healed.    .Patient Handout: Wound Care for Skin Biopsy Site   Taking Care of Your Skin Biopsy Site  Proper care of the biopsy site is essential for promoting healing and minimizing scarring. This handout provides instructions on how to care for your biopsy site to ensure optimal recovery.  1. Cleaning the Wound:  Clean the biopsy site daily with gentle soap and water. Gently pat the area dry with a clean, soft towel. Avoid harsh scrubbing or rubbing the area, as this can irritate the skin and delay healing.   2. Applying Aquaphor and Bandage:  After cleaning the wound, apply a thin layer of Aquaphor ointment to the biopsy site. Cover the area with a sterile bandage to protect it from dirt, bacteria, and friction. Change the bandage daily or as needed if it becomes soiled or wet. 3. Continued Care for One Week:  Repeat the cleaning, Aquaphor application, and bandaging process daily for one week following the biopsy procedure. Keeping the wound clean and moist during this initial healing period will help prevent infection and promote optimal healing.   4. Massaging Aquaphor into the Area:  After one week, discontinue the use of bandages but continue to apply Aquaphor to the biopsy site. Gently massage the Aquaphor into the area using circular motions. Massaging the skin helps to promote circulation and prevent the formation of scar tissue.   Additional Tips:  Avoid exposing the biopsy site to direct sunlight during the healing process, as this can cause hyperpigmentation or worsen scarring. If you experience any signs of infection, such as increased redness, swelling, warmth, or drainage from the wound, contact your healthcare provider immediately. Follow any additional instructions provided by your healthcare provider for caring for the biopsy site and managing any  discomfort. Conclusion:  Taking proper care of your skin biopsy site is crucial for ensuring optimal healing and minimizing scarring. By following these instructions for cleaning, applying Aquaphor, and massaging the area, you can promote a smooth and successful recovery. If you have any questions or concerns about caring for your biopsy site, don't hesitate to contact your healthcare provider for guidance.

## 2022-05-11 ENCOUNTER — Telehealth: Payer: Self-pay | Admitting: Dermatology

## 2022-05-13 NOTE — Telephone Encounter (Signed)
Spoke to patient about results. Referral is sent for excision

## 2022-05-19 NOTE — Telephone Encounter (Signed)
Hi Neva,  I don't see the referral for Dr. Ulice Bold with Plastic Surgery for excisions in the patient chart.  Can you double check this was sent.  Thanks :)

## 2022-05-21 NOTE — Progress Notes (Signed)
I'm dropping a results note here to match the telephone note sent on 05/11/22 for pathology results tracking  Please confirm that pt is aware of bx results and that a referral is sent to Dr. Ulice Bold for SE of both lesions.  Thanks!

## 2022-05-25 ENCOUNTER — Other Ambulatory Visit: Payer: Self-pay

## 2022-05-27 NOTE — Telephone Encounter (Signed)
Patient wanted to go to Dr. Park Liter. I spoke with Selena Batten at his office for scheduling. They have the biopsy results and are scheduling patient for mohs

## 2022-06-23 DIAGNOSIS — E119 Type 2 diabetes mellitus without complications: Secondary | ICD-10-CM | POA: Diagnosis not present

## 2022-06-23 DIAGNOSIS — I10 Essential (primary) hypertension: Secondary | ICD-10-CM | POA: Diagnosis not present

## 2022-06-23 DIAGNOSIS — Z03818 Encounter for observation for suspected exposure to other biological agents ruled out: Secondary | ICD-10-CM | POA: Diagnosis not present

## 2022-06-23 DIAGNOSIS — Z1331 Encounter for screening for depression: Secondary | ICD-10-CM | POA: Diagnosis not present

## 2022-06-23 DIAGNOSIS — N400 Enlarged prostate without lower urinary tract symptoms: Secondary | ICD-10-CM | POA: Diagnosis not present

## 2022-06-23 DIAGNOSIS — E7800 Pure hypercholesterolemia, unspecified: Secondary | ICD-10-CM | POA: Diagnosis not present

## 2022-06-23 DIAGNOSIS — J310 Chronic rhinitis: Secondary | ICD-10-CM | POA: Diagnosis not present

## 2022-06-23 DIAGNOSIS — R0602 Shortness of breath: Secondary | ICD-10-CM | POA: Diagnosis not present

## 2022-06-23 DIAGNOSIS — Z79899 Other long term (current) drug therapy: Secondary | ICD-10-CM | POA: Diagnosis not present

## 2022-06-23 DIAGNOSIS — E1169 Type 2 diabetes mellitus with other specified complication: Secondary | ICD-10-CM | POA: Diagnosis not present

## 2022-06-23 DIAGNOSIS — E0869 Diabetes mellitus due to underlying condition with other specified complication: Secondary | ICD-10-CM | POA: Diagnosis not present

## 2022-06-23 DIAGNOSIS — Z0000 Encounter for general adult medical examination without abnormal findings: Secondary | ICD-10-CM | POA: Diagnosis not present

## 2022-06-23 DIAGNOSIS — D692 Other nonthrombocytopenic purpura: Secondary | ICD-10-CM | POA: Diagnosis not present

## 2022-06-23 DIAGNOSIS — I872 Venous insufficiency (chronic) (peripheral): Secondary | ICD-10-CM | POA: Diagnosis not present

## 2022-06-23 DIAGNOSIS — K5900 Constipation, unspecified: Secondary | ICD-10-CM | POA: Diagnosis not present

## 2022-07-13 DIAGNOSIS — D485 Neoplasm of uncertain behavior of skin: Secondary | ICD-10-CM | POA: Diagnosis not present

## 2022-07-13 DIAGNOSIS — D0362 Melanoma in situ of left upper limb, including shoulder: Secondary | ICD-10-CM | POA: Diagnosis not present

## 2022-07-13 DIAGNOSIS — L570 Actinic keratosis: Secondary | ICD-10-CM | POA: Diagnosis not present

## 2022-07-13 DIAGNOSIS — C44629 Squamous cell carcinoma of skin of left upper limb, including shoulder: Secondary | ICD-10-CM | POA: Diagnosis not present

## 2022-07-13 DIAGNOSIS — L578 Other skin changes due to chronic exposure to nonionizing radiation: Secondary | ICD-10-CM | POA: Diagnosis not present

## 2022-07-13 DIAGNOSIS — D0439 Carcinoma in situ of skin of other parts of face: Secondary | ICD-10-CM | POA: Diagnosis not present

## 2022-07-20 ENCOUNTER — Telehealth: Payer: Self-pay

## 2022-07-20 NOTE — Telephone Encounter (Signed)
Dr. Daphine Deutscher from [facility] called. She wants a call back regarding your mutual patient. Her cell # [phone]

## 2022-08-03 DIAGNOSIS — L989 Disorder of the skin and subcutaneous tissue, unspecified: Secondary | ICD-10-CM | POA: Diagnosis not present

## 2022-08-03 DIAGNOSIS — D034 Melanoma in situ of scalp and neck: Secondary | ICD-10-CM | POA: Diagnosis not present

## 2022-08-17 DIAGNOSIS — Z48817 Encounter for surgical aftercare following surgery on the skin and subcutaneous tissue: Secondary | ICD-10-CM | POA: Diagnosis not present

## 2022-08-19 DIAGNOSIS — E785 Hyperlipidemia, unspecified: Secondary | ICD-10-CM | POA: Diagnosis not present

## 2022-08-19 DIAGNOSIS — E669 Obesity, unspecified: Secondary | ICD-10-CM | POA: Diagnosis not present

## 2022-08-19 DIAGNOSIS — Z7982 Long term (current) use of aspirin: Secondary | ICD-10-CM | POA: Diagnosis not present

## 2022-08-19 DIAGNOSIS — I10 Essential (primary) hypertension: Secondary | ICD-10-CM | POA: Diagnosis not present

## 2022-08-19 DIAGNOSIS — N400 Enlarged prostate without lower urinary tract symptoms: Secondary | ICD-10-CM | POA: Diagnosis not present

## 2022-08-19 DIAGNOSIS — K5900 Constipation, unspecified: Secondary | ICD-10-CM | POA: Diagnosis not present

## 2022-08-19 DIAGNOSIS — D692 Other nonthrombocytopenic purpura: Secondary | ICD-10-CM | POA: Diagnosis not present

## 2022-08-19 DIAGNOSIS — M4604 Spinal enthesopathy, thoracic region: Secondary | ICD-10-CM | POA: Diagnosis not present

## 2022-08-31 DIAGNOSIS — Z48817 Encounter for surgical aftercare following surgery on the skin and subcutaneous tissue: Secondary | ICD-10-CM | POA: Diagnosis not present

## 2022-08-31 DIAGNOSIS — D049 Carcinoma in situ of skin, unspecified: Secondary | ICD-10-CM | POA: Diagnosis not present

## 2022-09-29 DIAGNOSIS — Z48817 Encounter for surgical aftercare following surgery on the skin and subcutaneous tissue: Secondary | ICD-10-CM | POA: Diagnosis not present

## 2022-09-29 DIAGNOSIS — D049 Carcinoma in situ of skin, unspecified: Secondary | ICD-10-CM | POA: Diagnosis not present

## 2022-10-25 ENCOUNTER — Ambulatory Visit (INDEPENDENT_AMBULATORY_CARE_PROVIDER_SITE_OTHER): Payer: PPO | Admitting: Dermatology

## 2022-10-25 ENCOUNTER — Encounter: Payer: Self-pay | Admitting: Dermatology

## 2022-10-25 DIAGNOSIS — Z86006 Personal history of melanoma in-situ: Secondary | ICD-10-CM

## 2022-10-25 DIAGNOSIS — Z1283 Encounter for screening for malignant neoplasm of skin: Secondary | ICD-10-CM

## 2022-10-25 DIAGNOSIS — D229 Melanocytic nevi, unspecified: Secondary | ICD-10-CM

## 2022-10-25 DIAGNOSIS — L821 Other seborrheic keratosis: Secondary | ICD-10-CM

## 2022-10-25 DIAGNOSIS — Z85828 Personal history of other malignant neoplasm of skin: Secondary | ICD-10-CM | POA: Diagnosis not present

## 2022-10-25 DIAGNOSIS — L570 Actinic keratosis: Secondary | ICD-10-CM

## 2022-10-25 DIAGNOSIS — W908XXA Exposure to other nonionizing radiation, initial encounter: Secondary | ICD-10-CM

## 2022-10-25 DIAGNOSIS — L578 Other skin changes due to chronic exposure to nonionizing radiation: Secondary | ICD-10-CM

## 2022-10-25 DIAGNOSIS — L814 Other melanin hyperpigmentation: Secondary | ICD-10-CM

## 2022-10-25 DIAGNOSIS — Z85820 Personal history of malignant melanoma of skin: Secondary | ICD-10-CM

## 2022-10-25 NOTE — Patient Instructions (Addendum)
Cryotherapy Aftercare  Wash gently with soap and water everyday.   Apply Vaseline and Band-Aid daily until healed.   Important Information   Due to recent changes in healthcare laws, you may see results of your pathology and/or laboratory studies on MyChart before the doctors have had a chance to review them. We understand that in some cases there may be results that are confusing or concerning to you. Please understand that not all results are received at the same time and often the doctors may need to interpret multiple results in order to provide you with the best plan of care or course of treatment. Therefore, we ask that you please give [place] 2 business days to thoroughly review all your results before contacting the office for clarification. Should we see a critical lab result, you will be contacted sooner.     If You Need Anything After Your Visit   If you have any questions or concerns for your doctor, please call our main line at [phone]. If no one answers, please leave a voicemail as directed and we will return your call as soon as possible. Messages left after 4 pm will be answered the following business day.    You may also send [place] a message via MyChart. We typically respond to MyChart messages within 1-2 business days.  For prescription refills, please ask your pharmacy to contact our office. Our fax number is [phone].  If you have an urgent issue when the clinic is closed that cannot wait until the next business day, you can page your doctor at the number below.     Please note that while we do our best to be available for urgent issues outside of office hours, we are not available 24/7.    If you have an urgent issue and are unable to reach [place], you may choose to seek medical care at your doctor's office, retail clinic, urgent care center, or emergency room.   If you have a medical emergency, please immediately call 911 or go to the emergency department. In the event  of inclement weather, please call our main line at [phone] for an update on the status of any delays or closures.  Dermatology Medication Tips: Please keep the boxes that topical medications come in in order to help keep track of the instructions about where and how to use these. Pharmacies typically print the medication instructions only on the boxes and not directly on the medication tubes.   If your medication is too expensive, please contact our office at [phone] or send [place] a message through MyChart.    We are unable to tell what your co-pay for medications will be in advance as this is different depending on your insurance coverage. However, we may be able to find a substitute medication at lower cost or fill out paperwork to get insurance to cover a needed medication.    If a prior authorization is required to get your medication covered by your insurance company, please allow [place] 1-2 business days to complete this process.   Drug prices often vary depending on where the prescription is filled and some pharmacies may offer cheaper prices.   The website www.goodrx.com contains coupons for medications through different pharmacies. The prices here do not account for what the cost may be with help from insurance (it may be cheaper with your insurance), but the website can give you the price if you did not use any insurance.  - You can print the  associated coupon and take it with your prescription to the pharmacy.  - You may also stop by our office during regular business hours and pick up a GoodRx coupon card.  - If you need your prescription sent electronically to a different pharmacy, notify our office through [facility] or by phone at [phone]

## 2022-10-25 NOTE — Progress Notes (Signed)
Follow-Up Visit   Subjective  Aaron Good is a 89 y.o. male who presents for the following: AK  Patient present today for follow up visit for AK. Patient was last evaluated on 04/22/22. Patient reports sxs are better. Patient denies medication changes. Patient reports he was using the Efudex on his frontal scalp for 2 weeks and [facility] with [facility] with Dr. Modena Jansky. She advised to use for another 2 weeks. Pt reports his last dose was about 6-8 weeks ago. He has concerns about new dry spots on his hands.  The following portions of the chart were reviewed this encounter and updated as appropriate: medications, allergies, medical history  Review of Systems:  No other skin or systemic complaints except as noted in HPI or Assessment and Plan.  Objective  Well appearing patient in no apparent distress; mood and affect are within normal limits.  A focused examination was performed of the following areas: Scalp and Hands  Relevant exam findings are noted in the Assessment and Plan.  Left Forearm - Posterior (3), Left Frontal Scalp, Left Temple, Left Zygomatic Area, Mid Forehead, Mid Frontal Scalp (4), Mid Occipital Scalp (2), Mid Parietal Scalp (5), Right Breast, Right Forearm - Posterior (3), Right Forehead, Right Hand - Posterior (2), Right Temple Erythematous thin papules/macules with gritty scale.     Assessment & Plan   HISTORY OF MELANOMA IN SITU - No evidence of recurrence today - No lymphadenopathy - Recommend regular full body skin exams - Recommend daily broad spectrum sunscreen SPF 30+ to sun-exposed areas, reapply every 2 hours as needed.  - Call if any new or changing lesions are noted between office visits  HISTORY OF SQUAMOUS CELL CARCINOMA OF THE SKIN - No evidence of recurrence today - No lymphadenopathy - Recommend regular full body skin exams - Recommend daily broad spectrum sunscreen SPF 30+ to sun-exposed areas, reapply every 2 hours as  needed.  - Call if any new or changing lesions are noted between office visits       LENTIGINES, SEBORRHEIC KERATOSES, HEMANGIOMAS - Benign normal skin lesions - Benign-appearing - Call for any changes  MELANOCYTIC NEVI - Tan-brown and/or pink-flesh-colored symmetric macules and papules - Benign appearing on exam today - Observation - Call clinic for new or changing moles - Recommend daily use of broad spectrum spf 30+ sunscreen to sun-exposed areas.   ACTINIC DAMAGE - Chronic condition, secondary to cumulative UV/sun exposure - diffuse scaly erythematous macules with underlying dyspigmentation - Recommend daily broad spectrum sunscreen SPF 30+ to sun-exposed areas, reapply every 2 hours as needed.  - Staying in the shade or wearing long sleeves, sun glasses (UVA+UVB protection) and wide brim hats (4-inch brim around the entire circumference of the hat) are also recommended for sun protection.  - Call for new or changing lesions.  SKIN CANCER SCREENING PERFORMED TODAY Right shoulder well healed scar from [facility]  Multiple ACTINIC KERATOSIS Exam: Erythematous thin papules/macules with gritty scale at the Scalp  Actinic keratoses are precancerous spots that appear secondary to cumulative UV radiation exposure/sun exposure over time. They are chronic with expected duration over 1 year. A portion of actinic keratoses will progress to squamous cell carcinoma of the skin. It is not possible to reliably predict which spots will progress to skin cancer and so treatment is recommended to prevent development of skin cancer.  Recommend daily broad spectrum sunscreen SPF 30+ to sun-exposed areas, reapply every 2 hours as needed.  Recommend  staying in the shade or wearing long sleeves, sun glasses (UVA+UVB protection) and wide brim hats (4-inch brim around the entire circumference of the hat). Call for new or changing lesions.  Treatment Plan:  AK (actinic keratosis) (26) Right  Hand - Posterior (2); Left Forearm - Posterior (3); Right Forearm - Posterior (3); Right Breast; Left Frontal Scalp; Mid Frontal Scalp (4); Mid Parietal Scalp (5); Mid Occipital Scalp (2); Mid Forehead; Left Temple; Right Temple; Left Zygomatic Area; Right Forehead  Destruction of lesion - Left Forearm - Posterior (3), Left Frontal Scalp, Left Temple, Left Zygomatic Area, Mid Forehead, Mid Frontal Scalp (4), Mid Occipital Scalp (2), Mid Parietal Scalp (5), Right Breast, Right Forearm - Posterior (3), Right Forehead, Right Hand - Posterior (2), Right Temple Complexity: simple   Destruction method: cryotherapy   Informed consent: discussed and consent obtained   Timeout:  patient name, date of birth, surgical site, and procedure verified Lesion destroyed using liquid nitrogen: Yes   Cryotherapy cycles:  25 Post-procedure details: wound care instructions given    Return in about 5 months (around 03/27/2023) for AK F/U.  Documentation: I have reviewed the above documentation for accuracy and completeness, and I agree with the above.  Stasia Cavalier, am acting as scribe for Langston Reusing, DO.  Langston Reusing, DO

## 2022-12-20 DIAGNOSIS — M5386 Other specified dorsopathies, lumbar region: Secondary | ICD-10-CM | POA: Diagnosis not present

## 2022-12-20 DIAGNOSIS — M4604 Spinal enthesopathy, thoracic region: Secondary | ICD-10-CM | POA: Diagnosis not present

## 2022-12-20 DIAGNOSIS — M9903 Segmental and somatic dysfunction of lumbar region: Secondary | ICD-10-CM | POA: Diagnosis not present

## 2022-12-20 DIAGNOSIS — M9902 Segmental and somatic dysfunction of thoracic region: Secondary | ICD-10-CM | POA: Diagnosis not present

## 2022-12-20 DIAGNOSIS — M9905 Segmental and somatic dysfunction of pelvic region: Secondary | ICD-10-CM | POA: Diagnosis not present

## 2022-12-22 DIAGNOSIS — I10 Essential (primary) hypertension: Secondary | ICD-10-CM | POA: Diagnosis not present

## 2022-12-22 DIAGNOSIS — E7800 Pure hypercholesterolemia, unspecified: Secondary | ICD-10-CM | POA: Diagnosis not present

## 2022-12-22 DIAGNOSIS — K5900 Constipation, unspecified: Secondary | ICD-10-CM | POA: Diagnosis not present

## 2022-12-22 DIAGNOSIS — N400 Enlarged prostate without lower urinary tract symptoms: Secondary | ICD-10-CM | POA: Diagnosis not present

## 2022-12-22 DIAGNOSIS — R0602 Shortness of breath: Secondary | ICD-10-CM | POA: Diagnosis not present

## 2022-12-22 DIAGNOSIS — Z79899 Other long term (current) drug therapy: Secondary | ICD-10-CM | POA: Diagnosis not present

## 2022-12-22 DIAGNOSIS — I872 Venous insufficiency (chronic) (peripheral): Secondary | ICD-10-CM | POA: Diagnosis not present

## 2022-12-22 DIAGNOSIS — D692 Other nonthrombocytopenic purpura: Secondary | ICD-10-CM | POA: Diagnosis not present

## 2022-12-22 DIAGNOSIS — E1169 Type 2 diabetes mellitus with other specified complication: Secondary | ICD-10-CM | POA: Diagnosis not present

## 2022-12-22 DIAGNOSIS — E0869 Diabetes mellitus due to underlying condition with other specified complication: Secondary | ICD-10-CM | POA: Diagnosis not present

## 2022-12-23 DIAGNOSIS — M9902 Segmental and somatic dysfunction of thoracic region: Secondary | ICD-10-CM | POA: Diagnosis not present

## 2022-12-23 DIAGNOSIS — M9905 Segmental and somatic dysfunction of pelvic region: Secondary | ICD-10-CM | POA: Diagnosis not present

## 2022-12-23 DIAGNOSIS — M9903 Segmental and somatic dysfunction of lumbar region: Secondary | ICD-10-CM | POA: Diagnosis not present

## 2022-12-27 DIAGNOSIS — M9902 Segmental and somatic dysfunction of thoracic region: Secondary | ICD-10-CM | POA: Diagnosis not present

## 2022-12-27 DIAGNOSIS — M9903 Segmental and somatic dysfunction of lumbar region: Secondary | ICD-10-CM | POA: Diagnosis not present

## 2022-12-27 DIAGNOSIS — M9905 Segmental and somatic dysfunction of pelvic region: Secondary | ICD-10-CM | POA: Diagnosis not present

## 2022-12-30 DIAGNOSIS — M9903 Segmental and somatic dysfunction of lumbar region: Secondary | ICD-10-CM | POA: Diagnosis not present

## 2022-12-30 DIAGNOSIS — M9902 Segmental and somatic dysfunction of thoracic region: Secondary | ICD-10-CM | POA: Diagnosis not present

## 2022-12-30 DIAGNOSIS — M9905 Segmental and somatic dysfunction of pelvic region: Secondary | ICD-10-CM | POA: Diagnosis not present

## 2023-03-24 ENCOUNTER — Ambulatory Visit: Payer: PPO | Admitting: Dermatology

## 2023-03-24 ENCOUNTER — Encounter: Payer: Self-pay | Admitting: Dermatology

## 2023-03-24 VITALS — BP 131/80 | HR 53

## 2023-03-24 DIAGNOSIS — C44329 Squamous cell carcinoma of skin of other parts of face: Secondary | ICD-10-CM

## 2023-03-24 DIAGNOSIS — W908XXA Exposure to other nonionizing radiation, initial encounter: Secondary | ICD-10-CM

## 2023-03-24 DIAGNOSIS — D485 Neoplasm of uncertain behavior of skin: Secondary | ICD-10-CM

## 2023-03-24 DIAGNOSIS — L821 Other seborrheic keratosis: Secondary | ICD-10-CM | POA: Diagnosis not present

## 2023-03-24 DIAGNOSIS — L570 Actinic keratosis: Secondary | ICD-10-CM

## 2023-03-24 DIAGNOSIS — D492 Neoplasm of unspecified behavior of bone, soft tissue, and skin: Secondary | ICD-10-CM

## 2023-03-24 DIAGNOSIS — L578 Other skin changes due to chronic exposure to nonionizing radiation: Secondary | ICD-10-CM

## 2023-03-24 MED ORDER — TRIAMCINOLONE ACETONIDE 0.025 % EX OINT: 1.0000 | TOPICAL_OINTMENT | Freq: Two times a day (BID) | CUTANEOUS | 0 refills | Status: DC

## 2023-03-24 NOTE — Patient Instructions (Addendum)
Hello Aaron Good,  Thank you for visiting [place] today. We are committed to addressing your skin health concerns and appreciate your dedication to following through with the treatment plan provided.  Here is a summary of the key instructions from today's consultation:  Biopsy Procedure: A biopsy will be performed on the 1.5 cm pink plaque with a central horn located on your right mandible. Jasmine December will be responsible for taking pictures and setting up for this procedure.  Efudex (Tolak) Cream:   Application: Apply a thin layer to your scalp, forehead, temples, and cheeks twice a day.   Duration: Continue this application for 2 weeks. If the reaction becomes too painful, you have the option to stop the treatment early.  Triamcinolone Ointment 0.025%:   Post-Efudex (Tolak) Treatment: After you have completed the Efudex treatment, apply this ointment twice a day to all treated areas to aid in the healing process.  Wynelle Link Protection: It is crucial to avoid sun exposure during the treatment period. Please wear hats and sunscreen if you must be outdoors.  Follow-Up:   Appointment: We will schedule a follow-up appointment in 6 months to monitor your progress.   Biopsy Results: We will call you with the results of the biopsy. If it confirms skin cancer, we will refer you to Dr. Pasi for Mohs surgery.  Aftercare for Biopsy Site:   Care Instructions: Apply Aquaphor to the biopsy site and cover it with a Band-Aid after washing it daily.  Please ensure to follow these instructions carefully to achieve the best possible outcome for your skin condition. If you have any questions or concerns before your next visit, please do not hesitate to contact our office.  Warm regards,  Dr. Langston Reusing     Cryotherapy Aftercare  Wash gently with soap and water everyday.   Apply Vaseline and Band-Aid daily until healed.    Patient Handout: Wound Care for Skin Biopsy Site  Taking Care of Your Skin Biopsy  Site  Proper care of the biopsy site is essential for promoting healing and minimizing scarring. This handout provides instructions on how to care for your biopsy site to ensure optimal recovery.  1. Cleaning the Wound:  Clean the biopsy site daily with gentle soap and water. Gently pat the area dry with a clean, soft towel. Avoid harsh scrubbing or rubbing the area, as this can irritate the skin and delay healing.  2. Applying Aquaphor and Bandage:  After cleaning the wound, apply a thin layer of Aquaphor ointment to the biopsy site. Cover the area with a sterile bandage to protect it from dirt, bacteria, and friction. Change the bandage daily or as needed if it becomes soiled or wet.  3. Continued Care for One Week:  Repeat the cleaning, Aquaphor application, and bandaging process daily for one week following the biopsy procedure. Keeping the wound clean and moist during this initial healing period will help prevent infection and promote optimal healing.  4. Massaging Aquaphor into the Area:  ---After one week, discontinue the use of bandages but continue to apply Aquaphor to the biopsy site. ----Gently massage the Aquaphor into the area using circular motions. ---Massaging the skin helps to promote circulation and prevent the formation of scar tissue.   Additional Tips:  Avoid exposing the biopsy site to direct sunlight during the healing process, as this can cause hyperpigmentation or worsen scarring. If you experience any signs of infection, such as increased redness, swelling, warmth, or drainage from the wound, contact your healthcare provider  immediately. Follow any additional instructions provided by your healthcare provider for caring for the biopsy site and managing any discomfort. Conclusion:  Taking proper care of your skin biopsy site is crucial for ensuring optimal healing and minimizing scarring. By following these instructions for cleaning, applying Aquaphor, and  massaging the area, you can promote a smooth and successful recovery. If you have any questions or concerns about caring for your biopsy site, don't hesitate to contact your healthcare provider for guidance.      Important Information  Due to recent changes in healthcare laws, you may see results of your pathology and/or laboratory studies on MyChart before the doctors have had a chance to review them. We understand that in some cases there may be results that are confusing or concerning to you. Please understand that not all results are received at the same time and often the doctors may need to interpret multiple results in order to provide you with the best plan of care or course of treatment. Therefore, we ask that you please give [place] 2 business days to thoroughly review all your results before contacting the office for clarification. Should we see a critical lab result, you will be contacted sooner.   If You Need Anything After Your Visit  If you have any questions or concerns for your doctor, please call our main line at [phone] If no one answers, please leave a voicemail as directed and we will return your call as soon as possible. Messages left after 4 pm will be answered the following business day.   You may also send [place] a message via MyChart. We typically respond to MyChart messages within 1-2 business days.  For prescription refills, please ask your pharmacy to contact our office. Our fax number is [phone].  If you have an urgent issue when the clinic is closed that cannot wait until the next business day, you can page your doctor at the number below.    Please note that while we do our best to be available for urgent issues outside of office hours, we are not available 24/7.   If you have an urgent issue and are unable to reach [place], you may choose to seek medical care at your doctor's office, retail clinic, urgent care center, or emergency room.  If you have a medical  emergency, please immediately call 911 or go to the emergency department. In the event of inclement weather, please call our main line at [phone] for an update on the status of any delays or closures.  Dermatology Medication Tips: Please keep the boxes that topical medications come in in order to help keep track of the instructions about where and how to use these. Pharmacies typically print the medication instructions only on the boxes and not directly on the medication tubes.   If your medication is too expensive, please contact our office at [phone] or send [place] a message through MyChart.   We are unable to tell what your co-pay for medications will be in advance as this is different depending on your insurance coverage. However, we may be able to find a substitute medication at lower cost or fill out paperwork to get insurance to cover a needed medication.   If a prior authorization is required to get your medication covered by your insurance company, please allow [place] 1-2 business days to complete this process.  Drug prices often vary depending on where the prescription is filled and some pharmacies may offer cheaper prices.  The  website www.goodrx.com contains coupons for medications through different pharmacies. The prices here do not account for what the cost may be with help from insurance (it may be cheaper with your insurance), but the website can give you the price if you did not use any insurance.  - You can print the associated coupon and take it with your prescription to the pharmacy.  - You may also stop by our office during regular business hours and pick up a GoodRx coupon card.  - If you need your prescription sent electronically to a different pharmacy, notify our office through [facility] or by phone at [phone]

## 2023-03-24 NOTE — Progress Notes (Signed)
Follow-Up Visit   Subjective  Aaron Good is a 89 y.o. male who presents for the following: AK f/u  Patient present today for follow up visit. Patient was last evaluated on 10/25/22. At this visit patient was not prescribed any Rx but cryo was done on face/head & B/L arms. Patient reports sxs are better. Patient denies medication changes.  The following portions of the chart were reviewed this encounter and updated as appropriate: medications, allergies, medical history  Review of Systems:  No other skin or systemic complaints except as noted in HPI or Assessment and Plan.  Objective  Well appearing patient in no apparent distress; mood and affect are within normal limits.   A focused examination was performed of the following areas: upper body   Relevant exam findings are noted in the Assessment and Plan.    Right Mandible 1cm pink scaly plaque with central horn L cheek, R eyebrow, R temple, scalp (5) Erythematous thin papules/macules with gritty scale.   Assessment & Plan   ACTINIC KERATOSIS Exam: Erythematous thin papules/macules with gritty scale at the face/head & B/L arms  Actinic keratoses are precancerous spots that appear secondary to cumulative UV radiation exposure/sun exposure over time. They are chronic with expected duration over 1 year. A portion of actinic keratoses will progress to squamous cell carcinoma of the skin. It is not possible to reliably predict which spots will progress to skin cancer and so treatment is recommended to prevent development of skin cancer.  Recommend daily broad spectrum sunscreen SPF 30+ to sun-exposed areas, reapply every 2 hours as needed.  Recommend staying in the shade or wearing long sleeves, sun glasses (UVA+UVB protection) and wide brim hats (4-inch brim around the entire circumference of the hat). Call for new or changing lesions.  Treatment Plan: - Rx Tolak - apply BID for 2 weeks then stop. Full Size sample provided. -  Rx triamcinolone 0.0.25% - BID for 2 weeks after completing Tolak course.     SEBORRHEIC KERATOSIS - Stuck-on, waxy, tan-brown papules and/or plaques  - Benign-appearing - Discussed benign etiology and prognosis. - Observe - Call for any changes      NEOPLASM OF UNCERTAIN BEHAVIOR OF SKIN Right Mandible Skin / nail biopsy Type of biopsy: tangential   Informed consent: discussed and consent obtained   Timeout: patient name, date of birth, surgical site, and procedure verified   Procedure prep:  Patient was prepped and draped in usual sterile fashion Prep type:  Isopropyl alcohol Anesthesia: the lesion was anesthetized in a standard fashion   Anesthetic:  1% lidocaine w/ epinephrine 1-100,000 buffered w/ 8.4% NaHCO3 Instrument used: DermaBlade   Hemostasis achieved with: aluminum chloride   Outcome: patient tolerated procedure well   Post-procedure details: sterile dressing applied and wound care instructions given   Dressing type: petrolatum gauze and bandage   Specimen 1 - Surgical pathology Differential Diagnosis: r/o SCC  Check Margins: yes AK (ACTINIC KERATOSIS) (5) L cheek, R eyebrow, R temple, scalp (5) Destruction of lesion - L cheek, R eyebrow, R temple, scalp (5) Complexity: simple   Destruction method: cryotherapy   Informed consent: discussed and consent obtained   Timeout:  patient name, date of birth, surgical site, and procedure verified Lesion destroyed using liquid nitrogen: Yes   Region frozen until ice ball extended beyond lesion: Yes   Outcome: patient tolerated procedure well with no complications   Post-procedure details: wound care instructions given   ACTINIC SKIN DAMAGE   SEBORRHEIC KERATOSIS  No follow-ups on file.    Documentation: I have reviewed the above documentation for accuracy and completeness, and I agree with the above.  I, Shirron Marcha Solders, CMA, am acting as scribe for [employer], DO.   Langston Reusing, DO

## 2023-03-25 LAB — SURGICAL PATHOLOGY

## 2023-03-29 ENCOUNTER — Encounter: Payer: Self-pay | Admitting: Dermatology

## 2023-03-29 ENCOUNTER — Telehealth: Payer: Self-pay

## 2023-03-29 DIAGNOSIS — C4492 Squamous cell carcinoma of skin, unspecified: Secondary | ICD-10-CM | POA: Insufficient documentation

## 2023-03-29 NOTE — Telephone Encounter (Signed)
-----   Message from Langston Reusing sent at 03/29/2023  4:29 AM EST ----- Hi Renner Sebald, Please call patient and review results and refer for Mohs with Dr Caralyn Guile for positive skin cancer(s)  Thanks!  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

## 2023-03-29 NOTE — Telephone Encounter (Signed)
Pt has been scheduled for 2/25 @ 9.45am  R mandible - SCC

## 2023-03-29 NOTE — Progress Notes (Signed)
Hi Shirron, Please call patient and review results and refer for Mohs with Dr Caralyn Guile for positive skin cancer(s)  Thanks!  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

## 2023-04-04 ENCOUNTER — Encounter: Payer: Self-pay | Admitting: Dermatology

## 2023-04-05 ENCOUNTER — Encounter: Payer: Self-pay | Admitting: Dermatology

## 2023-04-05 ENCOUNTER — Ambulatory Visit: Payer: PPO | Admitting: Dermatology

## 2023-04-05 VITALS — BP 135/71 | HR 65 | Temp 98.7°F

## 2023-04-05 DIAGNOSIS — C44329 Squamous cell carcinoma of skin of other parts of face: Secondary | ICD-10-CM

## 2023-04-05 DIAGNOSIS — C4492 Squamous cell carcinoma of skin, unspecified: Secondary | ICD-10-CM

## 2023-04-05 DIAGNOSIS — L814 Other melanin hyperpigmentation: Secondary | ICD-10-CM

## 2023-04-05 DIAGNOSIS — L579 Skin changes due to chronic exposure to nonionizing radiation, unspecified: Secondary | ICD-10-CM

## 2023-04-05 NOTE — Progress Notes (Signed)
 Follow-Up Visit   Subjective  Aaron Good is a 89 y.o. male who presents for the following: Mohs of a Well Differentiated Squamous Cell Carcinoma on the right mandible, referred by Dr. Onalee Hua.  The following portions of the chart were reviewed this encounter and updated as appropriate: medications, allergies, medical history  Review of Systems:  No other skin or systemic complaints except as noted in HPI or Assessment and Plan.  Objective  Well appearing patient in no apparent distress; mood and affect are within normal limits.  A focused examination was performed of the following areas: Right mandible Relevant physical exam findings are noted in the Assessment and Plan.   Right  Mandible Healing biopsy site   Assessment & Plan   SQUAMOUS CELL CARCINOMA OF SKIN Right  Mandible Mohs surgery  Consent obtained: written  Anticoagulation: Was the anticoagulation regimen changed prior to Mohs? No    Anesthesia: Anesthesia method: local infiltration Local anesthetic: lidocaine 1% WITH epi  Procedure Details: Timeout: pre-procedure verification complete Procedure Prep: patient was prepped and draped in usual sterile fashion Prep type: chlorhexidine Pre-Op diagnosis: squamous cell carcinoma MohsAIQ Surgical site (if tumor spans multiple areas, please select predominant area): cheek (including jawline) Surgery side: right Surgical site (from skin exam): Right  Mandible Pre-operative length (cm): 1 Pre-operative width (cm): 1.1 Indications for Mohs surgery: anatomic location where tissue conservation is critical  Micrographic Surgery Details: Post-operative length (cm): 1.6 Post-operative width (cm): 1.2 Number of Mohs stages: 1  Skin repair Complexity:  Complex Final length (cm):  4.3 Informed consent: discussed and consent obtained   Timeout: patient name, date of birth, surgical site, and procedure verified   Procedure prep:  Patient was prepped and draped in usual  sterile fashion Prep type:  Chlorhexidine Anesthesia: the lesion was anesthetized in a standard fashion   Anesthetic:  1% lidocaine w/ epinephrine 1-100,000 buffered w/ 8.4% NaHCO3 Reason for type of repair: reduce tension to allow closure, allow closure of the large defect, preserve normal anatomy, allow side-to-side closure without requiring a flap or graft and compensate for the inelasticity of skin in this area   Undermining: area extensively undermined   Subcutaneous layers (deep stitches):  Suture size:  5-0 Suture type: Vicryl (polyglactin 910)   Stitches:  Buried vertical mattress Fine/surface layer approximation (top stitches):  Suture size:  6-0 Suture type: fast-absorbing plain gut   Stitches: simple running   Hemostasis achieved with: suture, pressure and electrodesiccation Outcome: patient tolerated procedure well with no complications   Post-procedure details: sterile dressing applied and wound care instructions given    Return in about 4 weeks (around 05/03/2023) for wound check.  Aaron Good, CMA, am acting as scribe for Gwenith Daily, MD.    04/05/2023  HISTORY OF PRESENT ILLNESS  Aaron Good is seen in consultation at the request of Dr. Onalee Hua for biopsy-proven Well Differentiated Squamous Cell Carcinoma on the right mandible/neck. They note that the area has been present for about 1 year increasing in size with time.  There is no history of previous treatment.  Reports no other new or changing lesions and has no other complaints today.  Medications and allergies: see patient chart.  Review of systems: Reviewed 8 systems and notable for the above skin cancer.  All other systems reviewed are unremarkable/negative, unless noted in the HPI. Past medical history, surgical history, family history, social history were also reviewed and are noted in the chart/questionnaire.    PHYSICAL EXAMINATION  General:  Well-appearing, in no acute distress, alert and oriented x 4.  Vitals reviewed in chart (if available).   Skin: Exam reveals a 1.0 x 1.1 cm erythematous papule and biopsy scar on the right neck/mandible. There are rhytids, telangiectasias, and lentigines, consistent with photodamage.   Biopsy report(s) reviewed, confirming the diagnosis.   ASSESSMENT  1) Well Differentiated Squamous Cell Carcinoma 2) photodamage 3) solar lentigines   PLAN   1. Due to location, size, histology, or recurrence and the likelihood of subclinical extension as well as the need to conserve normal surrounding tissue, the patient was deemed acceptable for Mohs micrographic surgery (MMS).  The nature and purpose of the procedure, associated benefits and risks including recurrence and scarring, possible complications such as pain, infection, and bleeding, and alternative methods of treatment if appropriate were discussed with the patient during consent. The lesion location was verified by the patient, by reviewing previous notes, pathology reports, and by photographs as well as angulation measurements if available.  Informed consent was reviewed and signed by the patient, and timeout was performed at 9:45 AM. See op note below.  2. For the photodamage and solar lentigines, sun protection discussed/information given on OTC sunscreens, and we recommend continued regular follow-up with primary dermatologist every 6 months or sooner for any growing, bleeding, or changing lesions. 3. Prognosis and future surveillance discussed. 4. Letter with treatment outcome sent to referring provider. 5. Pain acetaminophen/ibuprofen/oxycodone 5 mg   MOHS MICROGRAPHIC SURGERY AND RECONSTRUCTION  Initial size:   1.0 x 1.1 cm Surgical defect/wound size: 1.6 x 1.2 cm Anesthesia:    0.33% lidocaine with 1:200,000 epinephrine EBL:    <5 mL Complications:  None Repair type:   Complex SQ suture:   5-0 Vicryl Cutaneous suture:  6-0 Plain gut Final size of the repair: 4.3 cm  Stages: 1  STAGE I:  Anesthesia achieved with 0.5% lidocaine with 1:200,000 epinephrine. ChloraPrep applied. 1 section(s) excised using Mohs technique (this includes total peripheral and deep tissue margin excision and evaluation with frozen sections, excised and interpreted by the same physician). The tumor was first debulked and then excised with an approx. 2mm margin.  Hemostasis was achieved with electrocautery as needed.  The specimen was then oriented, subdivided/relaxed, inked, and processed using Mohs technique.    Frozen section analysis revealed a clear deep and peripheral margin.  Reconstruction  The surgical wound was then cleaned, prepped, and re-anesthetized as above. Wound edges were undermined extensively along at least one entire edge and at a distance equal to or greater than the width of the defect (see wound defect size above) in order to achieve closure and decrease wound tension and anatomic distortion. Redundant tissue repair including standing cone removal was performed. Hemostasis was achieved with electrocautery. Subcutaneous and epidermal tissues were approximated with the above sutures. The surgical site was then lightly scrubbed with sterile, saline-soaked gauze. The area was then bandaged using Vaseline ointment, non-adherent gauze, gauze pads, and tape to provide an adequate pressure dressing. The patient tolerated the procedure well, was given detailed written and verbal wound care instructions, and was discharged in good condition.   The patient will follow-up: 4 weeks.    Documentation: I have reviewed the above documentation for accuracy and completeness, and I agree with the above.  Gwenith Daily, MD

## 2023-04-05 NOTE — Patient Instructions (Signed)

## 2023-04-05 NOTE — Telephone Encounter (Signed)
 R mandible - MOHS done 04/05/23  Specimen tracking and history updated

## 2023-04-07 ENCOUNTER — Encounter: Payer: Self-pay | Admitting: Dermatology

## 2023-04-26 ENCOUNTER — Encounter: Payer: Self-pay | Admitting: Dermatology

## 2023-04-26 ENCOUNTER — Ambulatory Visit (INDEPENDENT_AMBULATORY_CARE_PROVIDER_SITE_OTHER): Payer: PPO | Admitting: Dermatology

## 2023-04-26 VITALS — BP 155/77 | HR 54

## 2023-04-26 DIAGNOSIS — W908XXA Exposure to other nonionizing radiation, initial encounter: Secondary | ICD-10-CM

## 2023-04-26 DIAGNOSIS — T8131XA Disruption of external operation (surgical) wound, not elsewhere classified, initial encounter: Secondary | ICD-10-CM | POA: Diagnosis not present

## 2023-04-26 DIAGNOSIS — T1490XD Injury, unspecified, subsequent encounter: Secondary | ICD-10-CM

## 2023-04-26 DIAGNOSIS — Z85828 Personal history of other malignant neoplasm of skin: Secondary | ICD-10-CM

## 2023-04-26 DIAGNOSIS — Z5111 Encounter for antineoplastic chemotherapy: Secondary | ICD-10-CM

## 2023-04-26 DIAGNOSIS — L539 Erythematous condition, unspecified: Secondary | ICD-10-CM | POA: Diagnosis not present

## 2023-04-26 DIAGNOSIS — C4492 Squamous cell carcinoma of skin, unspecified: Secondary | ICD-10-CM

## 2023-04-26 DIAGNOSIS — L570 Actinic keratosis: Secondary | ICD-10-CM | POA: Diagnosis not present

## 2023-04-26 NOTE — Progress Notes (Signed)
 Follow Up Visit   Subjective  Aaron Good is a 89 y.o. male who presents for the following: follow up from Mohs surgery   The patient presents for follow up from Mohs surgery for a SCC on the right mandible, treated on 04/05/23, repaired with linear closure. The patient has been bandaging the wound as directed. The endorse the following concerns: started bleeding recently.   He states that his wife has been in the hospital recently with ILD.   The following portions of the chart were reviewed this encounter and updated as appropriate: medications, allergies, medical history  Review of Systems:  No other skin or systemic complaints except as noted in HPI or Assessment and Plan.  Objective  Well appearing patient in no apparent distress; mood and affect are within normal limits.  A full examination was performed including scalp, head, and face. All findings within normal limits unless otherwise noted below.  Healing wound with mild erythema  Relevant physical exam findings are noted in the Assessment and Plan.    Assessment & Plan   ACTINIC KERATOSIS Exam: Erythematous thin papules/macules with gritty scale at the forehead and scalp with active reaction to topical 5FU  Actinic keratoses are precancerous spots that appear secondary to cumulative UV radiation exposure/sun exposure over time. They are chronic with expected duration over 1 year. A portion of actinic keratoses will progress to squamous cell carcinoma of the skin. It is not possible to reliably predict which spots will progress to skin cancer and so treatment is recommended to prevent development of skin cancer.  Recommend daily broad spectrum sunscreen SPF 30+ to sun-exposed areas, reapply every 2 hours as needed.  Recommend staying in the shade or wearing long sleeves, sun glasses (UVA+UVB protection) and wide brim hats (4-inch brim around the entire circumference of the hat). Call for new or changing  lesions.  Treatment Plan: Finish 5-fluorouracil cream twice a day for a total of 14 days to affected areas.  Reviewed course of treatment and expected reaction.  Patient advised to expect inflammation and crusting and advised that erosions are possible.  Patient advised to be diligent with sun protection during and after treatment. Handout with details of how to apply medication and what to expect provided. Counseled to keep medication out of reach of children and pets.  Reviewed course of treatment and expected reaction.  Patient advised to expect inflammation and crusting and advised that erosions are possible.  Patient advised to be diligent with sun protection during and after treatment. Handout with details of how to apply medication and what to expect provided. Counseled to keep medication out of reach of children and pets.   Healing s/p Mohs for SCC, treated on 04/05/23, repaired with linear closure. - Reassured that wound is healing well - Mild dehiscence - No evidence of infection - No swelling, induration, purulence, or tenderness out of proportion to the clinical exam, see photo above - Discussed that scars take up to 12 months to mature from the date of surgery - Recommend SPF 30+ to scar daily to prevent purple color from UV exposure during scar maturation process - Discussed that erythema and raised appearance of scar will fade over the next 4-6 months - OK to start scar massage at 4-6 weeks post-op - Can consider silicone based products for scar healing starting at 6 weeks post-op - Ok to continue ointment daily to wound under a bandage for another 12- weeks or until completely healed.  HISTORY OF SQUAMOUS  CELL CARCINOMA OF THE SKIN - No evidence of recurrence today - No lymphadenopathy - Recommend regular full body skin exams - Recommend daily broad spectrum sunscreen SPF 30+ to sun-exposed areas, reapply every 2 hours as needed.  - Call if any new or changing lesions are  noted between office visits  Return in about 2 months (around 06/26/2023).  I, Manual Meier, Surg Tech III, am acting as scribe for Gwenith Daily, MD.   Documentation: I have reviewed the above documentation for accuracy and completeness, and I agree with the above.  Gwenith Daily, MD

## 2023-05-05 ENCOUNTER — Ambulatory Visit: Admitting: Dermatology

## 2023-05-05 ENCOUNTER — Encounter: Payer: Self-pay | Admitting: Dermatology

## 2023-05-05 DIAGNOSIS — T8131XD Disruption of external operation (surgical) wound, not elsewhere classified, subsequent encounter: Secondary | ICD-10-CM

## 2023-05-05 DIAGNOSIS — L539 Erythematous condition, unspecified: Secondary | ICD-10-CM | POA: Diagnosis not present

## 2023-05-05 DIAGNOSIS — T85622A Displacement of permanent sutures, initial encounter: Secondary | ICD-10-CM

## 2023-05-05 DIAGNOSIS — I872 Venous insufficiency (chronic) (peripheral): Secondary | ICD-10-CM | POA: Diagnosis not present

## 2023-05-05 DIAGNOSIS — Z85828 Personal history of other malignant neoplasm of skin: Secondary | ICD-10-CM

## 2023-05-05 DIAGNOSIS — S81802A Unspecified open wound, left lower leg, initial encounter: Secondary | ICD-10-CM | POA: Diagnosis not present

## 2023-05-05 DIAGNOSIS — C4492 Squamous cell carcinoma of skin, unspecified: Secondary | ICD-10-CM

## 2023-05-05 DIAGNOSIS — T1490XD Injury, unspecified, subsequent encounter: Secondary | ICD-10-CM

## 2023-05-05 NOTE — Progress Notes (Signed)
   Follow Up Visit   Subjective  Aaron Good is a 89 y.o. male who presents for the following: follow up from Mohs surgery   The patient presents for follow up from Mohs surgery for a SCC on the right mandible, treated on 04/05/23, repaired with linear closure. The patient has been bandaging the wound as directed. The endorse the following concerns: still healing in, possible suture coming out.   The following portions of the chart were reviewed this encounter and updated as appropriate: medications, allergies, medical history  Review of Systems:  No other skin or systemic complaints except as noted in HPI or Assessment and Plan.  Objective  Well appearing patient in no apparent distress; mood and affect are within normal limits.  A full examination was performed including scalp, head, and face and bilateral lower extremities. All findings within normal limits unless otherwise noted below.  Healing wound with mild erythema  Relevant physical exam findings are noted in the Assessment and Plan.    Assessment & Plan    Healing s/p Mohs for SCC, treated on 04/05/23, repaired with linear closure. - Reassured that wound is healing well - Spitting suture removed today - Mild dehiscence - No evidence of infection - No swelling, induration, purulence, or tenderness out of proportion to the clinical exam, see photo above - Discussed that scars take up to 12 months to mature from the date of surgery - Recommend SPF 30+ to scar daily to prevent purple color from UV exposure during scar maturation process - Discussed that erythema and raised appearance of scar will fade over the next 4-6 months - OK to start scar massage at 4-6 weeks post-op - Can consider silicone based products for scar healing starting at 6 weeks post-op - Ok to continue ointment daily to wound under a bandage for another 12- weeks or until completely healed.  HISTORY OF SQUAMOUS CELL CARCINOMA OF THE SKIN - No evidence of  recurrence today - No lymphadenopathy - Recommend regular full body skin exams - Recommend daily broad spectrum sunscreen SPF 30+ to sun-exposed areas, reapply every 2 hours as needed.  - Call if any new or changing lesions are noted between office visits  Chronic Leg Wound of Left Lower Leg due to Venous Insufficiency The patient has a chronic leg wound that continues to require regular wound care. It is recommended to apply mupirocin to the wound daily and cover it with an absorbing bandage to promote healing and prevent infection. The patient was advised to keep their scheduled wound care appointment in early May for further evaluation and management. In the meantime, we will continue to monitor the wound's progress closely and adjust treatment as needed based on healing and any signs of complications. Mupirocin and a bandage applied today  Return in about 2 weeks (around 05/19/2023).  I, Manual Meier, Surg Tech III, am acting as scribe for Gwenith Daily, MD.   Documentation: I have reviewed the above documentation for accuracy and completeness, and I agree with the above.  Gwenith Daily, MD

## 2023-05-19 ENCOUNTER — Ambulatory Visit: Admitting: Dermatology

## 2023-05-19 ENCOUNTER — Encounter: Payer: Self-pay | Admitting: Dermatology

## 2023-05-19 VITALS — BP 137/67

## 2023-05-19 DIAGNOSIS — T8131XD Disruption of external operation (surgical) wound, not elsewhere classified, subsequent encounter: Secondary | ICD-10-CM | POA: Diagnosis not present

## 2023-05-19 DIAGNOSIS — C4492 Squamous cell carcinoma of skin, unspecified: Secondary | ICD-10-CM

## 2023-05-19 DIAGNOSIS — Z85828 Personal history of other malignant neoplasm of skin: Secondary | ICD-10-CM | POA: Diagnosis not present

## 2023-05-19 DIAGNOSIS — T85622A Displacement of permanent sutures, initial encounter: Secondary | ICD-10-CM

## 2023-05-19 DIAGNOSIS — L859 Epidermal thickening, unspecified: Secondary | ICD-10-CM

## 2023-05-19 DIAGNOSIS — S81802A Unspecified open wound, left lower leg, initial encounter: Secondary | ICD-10-CM

## 2023-05-19 DIAGNOSIS — S81802D Unspecified open wound, left lower leg, subsequent encounter: Secondary | ICD-10-CM

## 2023-05-19 DIAGNOSIS — L905 Scar conditions and fibrosis of skin: Secondary | ICD-10-CM

## 2023-05-19 DIAGNOSIS — I872 Venous insufficiency (chronic) (peripheral): Secondary | ICD-10-CM

## 2023-05-19 NOTE — Progress Notes (Signed)
   Follow-Up Visit   Subjective  Aaron Good is a 89 y.o. male who presents for the following: Non healing wound check of left lower leg - he has not been using vaseline or a bandage lately. He wanted it to dry out.  He is also here for a Mohs follow up on his right neck, he states that it is no longer oozing. Mohs surgery for a SCC on the right mandible, treated on 04/05/23, repaired with linear closure  The following portions of the chart were reviewed this encounter and updated as appropriate: medications, allergies, medical history  Review of Systems:  No other skin or systemic complaints except as noted in HPI or Assessment and Plan.  Objective  Well appearing patient in no apparent distress; mood and affect are within normal limits.   A focused examination was performed of the following areas: Left lower leg, face  Relevant exam findings are noted in the Assessment and Plan.     Assessment & Plan   Healing s/p Mohs for SCC, treated on 04/05/23, repaired with linear closure. - Reassured that wound is healing well - Spitting suture removed today - Mild dehiscence - No evidence of infection - No swelling, induration, purulence, or tenderness out of proportion to the clinical exam, see photo above - Discussed that scars take up to 12 months to mature from the date of surgery - Recommend SPF 30+ to scar daily to prevent purple color from UV exposure during scar maturation process - Discussed that erythema and raised appearance of scar will fade over the next 4-6 months - OK to start scar massage at 4-6 weeks post-op - Can consider silicone based products for scar healing starting at 6 weeks post-op - Ok to continue ointment daily to wound under a bandage for another 12- weeks or until completely healed.  HISTORY OF SQUAMOUS CELL CARCINOMA OF RIGHT MANDIBLE - No evidence of recurrence today - No lymphadenopathy - Recommend regular full body skin exams - Recommend daily broad  spectrum sunscreen SPF 30+ to sun-exposed areas, reapply every 2 hours as needed.  - Call if any new or changing lesions are noted between office visits  NON HEALING WOUND OF LEFT LOWER LEG Exam: Improving  Treatment Plan: Continue current wound care. Recommend compression socks. Keep wound center appointment 06/16/2023.  STASIS DERMATITIS with hyperkeratosis of shins Exam: Erythematous, scaly patches involving the ankle and distal lower leg with associated lower leg edema.  Stasis in the legs causes chronic leg swelling, which may result in itchy or painful rashes, skin discoloration, skin texture changes, and sometimes ulceration.  Recommend daily graduated compression hose/stockings- easiest to put on first thing in morning, remove at bedtime.  Elevate legs as much as possible. Avoid salt/sodium rich foods.  Treatment Plan: Recommend Cerave SA or Amlactin lotion - samples of both given today.  Return for Follow up as scheduled.  I, Joanie Coddington, CMA, am acting as scribe for Gwenith Daily, MD .   Documentation: I have reviewed the above documentation for accuracy and completeness, and I agree with the above.  Gwenith Daily, MD

## 2023-05-19 NOTE — Patient Instructions (Signed)

## 2023-06-16 ENCOUNTER — Encounter (HOSPITAL_BASED_OUTPATIENT_CLINIC_OR_DEPARTMENT_OTHER): Attending: General Surgery | Admitting: Internal Medicine

## 2023-06-16 DIAGNOSIS — I87312 Chronic venous hypertension (idiopathic) with ulcer of left lower extremity: Secondary | ICD-10-CM | POA: Insufficient documentation

## 2023-06-16 DIAGNOSIS — L97828 Non-pressure chronic ulcer of other part of left lower leg with other specified severity: Secondary | ICD-10-CM | POA: Diagnosis not present

## 2023-06-16 DIAGNOSIS — L97228 Non-pressure chronic ulcer of left calf with other specified severity: Secondary | ICD-10-CM | POA: Diagnosis not present

## 2023-06-27 DIAGNOSIS — N400 Enlarged prostate without lower urinary tract symptoms: Secondary | ICD-10-CM | POA: Diagnosis not present

## 2023-06-27 DIAGNOSIS — Z0000 Encounter for general adult medical examination without abnormal findings: Secondary | ICD-10-CM | POA: Diagnosis not present

## 2023-06-27 DIAGNOSIS — Z1331 Encounter for screening for depression: Secondary | ICD-10-CM | POA: Diagnosis not present

## 2023-06-27 DIAGNOSIS — K5900 Constipation, unspecified: Secondary | ICD-10-CM | POA: Diagnosis not present

## 2023-06-27 DIAGNOSIS — E1169 Type 2 diabetes mellitus with other specified complication: Secondary | ICD-10-CM | POA: Diagnosis not present

## 2023-06-27 DIAGNOSIS — I10 Essential (primary) hypertension: Secondary | ICD-10-CM | POA: Diagnosis not present

## 2023-06-27 DIAGNOSIS — E7800 Pure hypercholesterolemia, unspecified: Secondary | ICD-10-CM | POA: Diagnosis not present

## 2023-06-27 DIAGNOSIS — Z79899 Other long term (current) drug therapy: Secondary | ICD-10-CM | POA: Diagnosis not present

## 2023-06-27 DIAGNOSIS — R0602 Shortness of breath: Secondary | ICD-10-CM | POA: Diagnosis not present

## 2023-06-27 DIAGNOSIS — R5383 Other fatigue: Secondary | ICD-10-CM | POA: Diagnosis not present

## 2023-06-27 DIAGNOSIS — D692 Other nonthrombocytopenic purpura: Secondary | ICD-10-CM | POA: Diagnosis not present

## 2023-06-27 DIAGNOSIS — I872 Venous insufficiency (chronic) (peripheral): Secondary | ICD-10-CM | POA: Diagnosis not present

## 2023-07-07 ENCOUNTER — Encounter: Payer: Self-pay | Admitting: Dermatology

## 2023-08-17 DIAGNOSIS — Z961 Presence of intraocular lens: Secondary | ICD-10-CM | POA: Diagnosis not present

## 2023-08-17 DIAGNOSIS — H26493 Other secondary cataract, bilateral: Secondary | ICD-10-CM | POA: Diagnosis not present

## 2023-08-22 DIAGNOSIS — N401 Enlarged prostate with lower urinary tract symptoms: Secondary | ICD-10-CM | POA: Diagnosis not present

## 2023-08-22 DIAGNOSIS — R310 Gross hematuria: Secondary | ICD-10-CM | POA: Diagnosis not present

## 2023-08-22 DIAGNOSIS — R3914 Feeling of incomplete bladder emptying: Secondary | ICD-10-CM | POA: Diagnosis not present

## 2023-09-29 ENCOUNTER — Ambulatory Visit: Payer: PPO | Admitting: Dermatology

## 2023-12-28 DIAGNOSIS — E6609 Other obesity due to excess calories: Secondary | ICD-10-CM | POA: Diagnosis not present

## 2023-12-28 DIAGNOSIS — K5900 Constipation, unspecified: Secondary | ICD-10-CM | POA: Diagnosis not present

## 2023-12-28 DIAGNOSIS — I872 Venous insufficiency (chronic) (peripheral): Secondary | ICD-10-CM | POA: Diagnosis not present

## 2023-12-28 DIAGNOSIS — I10 Essential (primary) hypertension: Secondary | ICD-10-CM | POA: Diagnosis not present

## 2023-12-28 DIAGNOSIS — E7800 Pure hypercholesterolemia, unspecified: Secondary | ICD-10-CM | POA: Diagnosis not present

## 2023-12-28 DIAGNOSIS — Z6834 Body mass index (BMI) 34.0-34.9, adult: Secondary | ICD-10-CM | POA: Diagnosis not present

## 2023-12-28 DIAGNOSIS — E66811 Obesity, class 1: Secondary | ICD-10-CM | POA: Diagnosis not present

## 2023-12-28 DIAGNOSIS — E0869 Diabetes mellitus due to underlying condition with other specified complication: Secondary | ICD-10-CM | POA: Diagnosis not present

## 2023-12-28 DIAGNOSIS — E1169 Type 2 diabetes mellitus with other specified complication: Secondary | ICD-10-CM | POA: Diagnosis not present

## 2023-12-28 DIAGNOSIS — R0602 Shortness of breath: Secondary | ICD-10-CM | POA: Diagnosis not present

## 2023-12-28 DIAGNOSIS — N400 Enlarged prostate without lower urinary tract symptoms: Secondary | ICD-10-CM | POA: Diagnosis not present

## 2024-02-21 ENCOUNTER — Encounter: Payer: Self-pay | Admitting: Dermatology

## 2024-02-21 ENCOUNTER — Ambulatory Visit: Admitting: Dermatology

## 2024-02-21 VITALS — BP 160/70 | HR 66

## 2024-02-21 DIAGNOSIS — L578 Other skin changes due to chronic exposure to nonionizing radiation: Secondary | ICD-10-CM

## 2024-02-21 DIAGNOSIS — E7800 Pure hypercholesterolemia, unspecified: Secondary | ICD-10-CM | POA: Insufficient documentation

## 2024-02-21 DIAGNOSIS — E1169 Type 2 diabetes mellitus with other specified complication: Secondary | ICD-10-CM | POA: Insufficient documentation

## 2024-02-21 DIAGNOSIS — E66812 Obesity, class 2: Secondary | ICD-10-CM | POA: Insufficient documentation

## 2024-02-21 DIAGNOSIS — Z1283 Encounter for screening for malignant neoplasm of skin: Secondary | ICD-10-CM

## 2024-02-21 DIAGNOSIS — N400 Enlarged prostate without lower urinary tract symptoms: Secondary | ICD-10-CM | POA: Insufficient documentation

## 2024-02-21 DIAGNOSIS — K5730 Diverticulosis of large intestine without perforation or abscess without bleeding: Secondary | ICD-10-CM | POA: Insufficient documentation

## 2024-02-21 DIAGNOSIS — R319 Hematuria, unspecified: Secondary | ICD-10-CM | POA: Insufficient documentation

## 2024-02-21 DIAGNOSIS — D1801 Hemangioma of skin and subcutaneous tissue: Secondary | ICD-10-CM

## 2024-02-21 DIAGNOSIS — D692 Other nonthrombocytopenic purpura: Secondary | ICD-10-CM | POA: Insufficient documentation

## 2024-02-21 DIAGNOSIS — L570 Actinic keratosis: Secondary | ICD-10-CM

## 2024-02-21 DIAGNOSIS — I10 Essential (primary) hypertension: Secondary | ICD-10-CM | POA: Insufficient documentation

## 2024-02-21 DIAGNOSIS — L814 Other melanin hyperpigmentation: Secondary | ICD-10-CM

## 2024-02-21 DIAGNOSIS — L821 Other seborrheic keratosis: Secondary | ICD-10-CM

## 2024-02-21 DIAGNOSIS — L719 Rosacea, unspecified: Secondary | ICD-10-CM | POA: Insufficient documentation

## 2024-02-21 DIAGNOSIS — Z1211 Encounter for screening for malignant neoplasm of colon: Secondary | ICD-10-CM | POA: Insufficient documentation

## 2024-02-21 DIAGNOSIS — G44009 Cluster headache syndrome, unspecified, not intractable: Secondary | ICD-10-CM | POA: Insufficient documentation

## 2024-02-21 DIAGNOSIS — K5909 Other constipation: Secondary | ICD-10-CM | POA: Insufficient documentation

## 2024-02-21 DIAGNOSIS — D229 Melanocytic nevi, unspecified: Secondary | ICD-10-CM

## 2024-02-21 DIAGNOSIS — D126 Benign neoplasm of colon, unspecified: Secondary | ICD-10-CM | POA: Insufficient documentation

## 2024-02-21 DIAGNOSIS — U099 Post covid-19 condition, unspecified: Secondary | ICD-10-CM | POA: Insufficient documentation

## 2024-02-21 DIAGNOSIS — E66811 Obesity, class 1: Secondary | ICD-10-CM | POA: Insufficient documentation

## 2024-02-21 NOTE — Progress Notes (Addendum)
 "  Total Body Skin Exam (TBSE) Visit   Subjective  Aaron Good is a 89 y.o. male who presents for the following: Skin Cancer Screening and Full Body Skin Exam  Patient presents today for follow up visit for TBSE. Patient was last evaluated on 03/2023 . Patient denies medication changes. Patient reports he  does not have spots, moles and lesions of concern to be evaluated. Patient reports throughout his lifetime he  has had severe sun exposure. Currently, patient reports if he  has excessive sun exposure, he  does apply sunscreen and/or wears protective coverings. Patient reports he  has hx of bx. Patient reports  family history of skin cancers.  Skin , left lower nose BASAL CELL CARCINOMA, CARCINOMA, PIGMENTED PIGMENTED AND EPIDERMOID EPIDERMOID CYST 2013  Skin , left crown BASAL CELL CARCINOMA CARCINOMA WITH SCLEROSIS SCLEROSIS   Skin , lower right temple SQUAMOUS SQUAMOUS CELL CARCINOMA CARCINOMA IN SITU     The following portions of the chart were reviewed this encounter and updated as appropriate: medications, allergies, medical history  Review of Systems:  No other skin or systemic complaints except as noted in HPI or Assessment and Plan.  Objective  Well appearing patient in no apparent distress; mood and affect are within normal limits.  A full examination was performed including scalp, head, eyes, ears, nose, lips, neck, chest, axillae, abdomen, back, buttocks, bilateral upper extremities, bilateral lower extremities, hands, feet, fingers, toes, fingernails, and toenails. All findings within normal limits unless otherwise noted below.   Relevant physical exam findings are noted in the Assessment and Plan.  Aks, right scalp, top of scalp (2), right temple (2) , left forearm, right wrist (4) Erythematous thin papules/macules with gritty scale.   Assessment & Plan   LENTIGINES, SEBORRHEIC KERATOSES, HEMANGIOMAS - Benign normal skin lesions - Benign-appearing - Call for any  changes  MELANOCYTIC NEVI - Tan-brown and/or pink-flesh-colored symmetric macules and papules - Benign appearing on exam today - Observation - Call clinic for new or changing moles - Recommend daily use of broad spectrum spf 30+ sunscreen to sun-exposed areas.   ACTINIC DAMAGE - Chronic condition, secondary to cumulative UV/sun exposure - diffuse scaly erythematous macules with underlying dyspigmentation - Recommend daily broad spectrum sunscreen SPF 30+ to sun-exposed areas, reapply every 2 hours as needed.  - Staying in the shade or wearing long sleeves, sun glasses (UVA+UVB protection) and wide brim hats (4-inch brim around the entire circumference of the hat) are also recommended for sun protection.  - Call for new or changing lesions.  SKIN CANCER SCREENING PERFORMED TODAY.  Aks, right scalp, top of scalp (2), right temple (2) , left forearm, right wrist   Recommended moisturizer with Urea SKIN EXAM FOR MALIGNANT NEOPLASM   MULTIPLE BENIGN MELANOCYTIC NEVI   CHERRY ANGIOMA   LENTIGINES   SEBORRHEIC KERATOSIS   ACTINIC SKIN DAMAGE   AK (ACTINIC KERATOSIS) (4) Aks, right scalp, top of scalp (2), right temple (2) , left forearm, right wrist (4) - Destruction of lesion - Aks, right scalp, top of scalp (2), right temple (2) , left forearm, right wrist (4) Complexity: simple   Destruction method: cryotherapy   Informed consent: discussed and consent obtained   Timeout:  patient name, date of birth, surgical site, and procedure verified Lesion destroyed using liquid nitrogen: Yes   Post-procedure details: wound care instructions given     Return in about 9 months (around 11/20/2024) for TBSE.  I, Jetta Ager, am acting as neurosurgeon for  Delon Lenis, DO. I, Lyle Cords, am acting as scribe for [employer], DO.  Documentation: I have reviewed the above documentation for accuracy and completeness, and I agree with the above.  Delon Lenis, DO   "

## 2024-02-21 NOTE — Patient Instructions (Signed)

## 2024-03-16 NOTE — Addendum Note (Signed)
 Addended by: ALM DELON SAILOR on: 03/16/2024 09:52 AM   Modules accepted: Orders, Level of Service

## 2024-12-03 ENCOUNTER — Ambulatory Visit: Admitting: Dermatology
